# Patient Record
Sex: Female | Born: 1980 | Race: White | Hispanic: No | Marital: Single
Health system: Midwestern US, Academic
[De-identification: ages and names within clinical notes are randomized; demographics above are authoritative.]

---

## 2016-08-21 ENCOUNTER — Encounter
Admit: 2016-08-21 | Discharge: 2016-08-21 | Payer: MEDICAID | Primary: Student in an Organized Health Care Education/Training Program

## 2016-08-21 MED ORDER — PENTOSAN POLYSULFATE SODIUM 100 MG PO CAP
100 mg | ORAL_CAPSULE | Freq: Three times a day (TID) | ORAL | 11 refills | Status: AC
Start: 2016-08-21 — End: 2017-07-03

## 2016-09-19 ENCOUNTER — Ambulatory Visit
Admit: 2016-09-19 | Discharge: 2016-09-19 | Payer: MEDICAID | Primary: Student in an Organized Health Care Education/Training Program

## 2016-09-19 ENCOUNTER — Encounter
Admit: 2016-09-19 | Discharge: 2016-09-19 | Payer: MEDICAID | Primary: Student in an Organized Health Care Education/Training Program

## 2016-09-19 DIAGNOSIS — R102 Pelvic and perineal pain: ICD-10-CM

## 2016-09-19 DIAGNOSIS — N3010 Interstitial cystitis (chronic) without hematuria: Principal | ICD-10-CM

## 2016-09-19 DIAGNOSIS — J449 Chronic obstructive pulmonary disease, unspecified: ICD-10-CM

## 2016-09-19 DIAGNOSIS — I219 Acute myocardial infarction, unspecified: ICD-10-CM

## 2016-09-19 DIAGNOSIS — F99 Mental disorder, not otherwise specified: ICD-10-CM

## 2016-09-19 DIAGNOSIS — C801 Malignant (primary) neoplasm, unspecified: ICD-10-CM

## 2016-09-19 DIAGNOSIS — K929 Disease of digestive system, unspecified: ICD-10-CM

## 2016-09-19 MED ORDER — OXYBUTYNIN CHLORIDE 15 MG PO TR24
15 mg | ORAL_TABLET | Freq: Every day | ORAL | 3 refills | 12.00000 days | Status: AC
Start: 2016-09-19 — End: 2016-11-01

## 2016-09-19 MED ORDER — PENTOSAN POLYSULFATE SODIUM 100 MG PO CAP
100 mg | ORAL_CAPSULE | Freq: Three times a day (TID) | ORAL | 11 refills | Status: AC
Start: 2016-09-19 — End: 2017-08-01

## 2016-09-19 NOTE — Progress Notes
Date of Service: 09/19/2016     Chief Complaint   Patient presents with   ??? Bladder Pain       Subjective:             Adriana Tran is a 36 y.o. female.    History of Present Illness    36 yo female with history of IC previously managed by Dr. Littie Deeds.  At first visit, she had discontinued her triple therapy.  We restarted therapy and continued mirabegron but not covered.  We also referred for pelvic floor PT.  UA neg.  PVR 8 cc.  Currently complaining of urge incontinence and wearing pads daily.  AUA ss 13, bother 5.     Review of Systems      Constitutional: Negative for fever, chills, fatigue and unexpected weight change.   HENT: Negative for hearing loss, sore throat and voice change.    Eyes: Negative for visual disturbance.   Respiratory: Negative for cough, shortness of breath and wheezing.    Cardiovascular: Negative for chest pain, palpitations and leg swelling.   Gastrointestinal: Negative for nausea, vomiting, abdominal pain, diarrhea and constipation.   Endocrine: Negative for polydipsia and polyuria.   Musculoskeletal: Negative for back pain, joint swelling and arthralgias.   Skin: Negative for rash.   Neurological: Negative for dizziness, seizures, weakness, light-headedness and headaches.   Hematological: Negative for adenopathy. Does not bruise/bleed easily.   Psychiatric/Behavioral: Negative for suicidal ideas and confusion.       Objective:         ??? CARIPRAZINE HCL (VRAYLAR PO) Take 3 mg by mouth daily.   ??? diazePAM (VALIUM) 5 mg tablet Take 5 mg by mouth twice daily.   ??? dicyclomine (BENTYL) 20 mg tablet Take 20 mg by mouth four times daily.   ??? emtricitabine/tenofovir (TRUVADA) 200/300 mg tablet Take 1 tablet by mouth daily. Indications: PREVENTION OF HIV INFECTION AFTER EXPOSURE   ??? gabapentin (NEURONTIN) 600 mg tablet Take 600 mg by mouth three times daily.   ??? hydrOXYzine (ATARAX) 25 mg tablet Take 25 mg by mouth twice daily. ??? lithium carbonate 300 mg tablet Take 300 mg by mouth three times daily. Take with food.   ??? loratadine (CLARITIN) 10 mg tablet Take 10 mg by mouth daily.   ??? MULTIVITAMINS WITH FLUORIDE (MULTI-VITAMIN PO) Take  by mouth.   ??? MYRBETRIQ 25 mg Tb24 Take 1 Tab by mouth daily.   ??? OLANZapine (ZYPREXA) 20 mg tablet Take 20 mg by mouth twice daily.   ??? OXcarbazepine (TRILEPTAL) 600 mg tablet Take 600 mg by mouth twice daily.   ??? pentosan polysulfate sodium (ELMIRON) 100 mg capsule Take 1 capsule by mouth three times daily. Take w/ water 1 hr before or 2 hrs after meals   ??? prazosin (MINIPRESS) 1 mg capsule Take 1 mg by mouth at bedtime daily.   ??? raltegravir (ISENTRESS) 400 mg tablet Take 1 tablet by mouth twice daily.   ??? rOPINIRole (REQUIP) 1 mg tablet Take 1 mg by mouth three times daily.   ??? traZODone (DESYREL) 100 mg tablet Take 100 mg by mouth at bedtime daily.   ??? triamcinolone acetonide (KENALOG) 0.1 % topical ointment to raised red skin only twice daily as needed, avoid face/grion/armpits     Vitals:    09/19/16 1335   BP: 104/64   Pulse: 72   Weight: 94.2 kg (207 lb 9.6 oz)   Height: 174 cm (68.5)     Body mass index  is 31.11 kg/m???.     Physical Exam    Gen - NAD  HENT - normocephalic  Neck - normal range of motion  Heart - normal rate  Lungs - normal effort  Ext - no edema, erythema or tenderness  Neuro - alert and oriented x 3  Skin - warm and dry  Psych - normal behavior       Assessment and Plan:    Visit Dx:  1. Interstitial cystitis    2. Pelvic pain      Ditropan 15mg XL  Restart ditropan XL  F/U in 1 year with Danella Sensing                Orders Placed This Encounter   ??? POC URINE DIPSTICK MANUAL READ       Vonna Drafts, MD, MPH

## 2016-09-19 NOTE — Progress Notes
PVR 8 ml

## 2016-09-26 ENCOUNTER — Encounter
Admit: 2016-09-26 | Discharge: 2016-09-26 | Payer: MEDICAID | Primary: Student in an Organized Health Care Education/Training Program

## 2016-09-26 NOTE — Telephone Encounter
Patient called in with a question regarding Myrbetriq script.     Called and left message for patient to return call to clinic.

## 2016-09-29 ENCOUNTER — Encounter
Admit: 2016-09-29 | Discharge: 2016-09-29 | Payer: MEDICAID | Primary: Student in an Organized Health Care Education/Training Program

## 2016-09-29 ENCOUNTER — Ambulatory Visit
Admit: 2016-09-29 | Discharge: 2016-09-29 | Payer: MEDICAID | Primary: Student in an Organized Health Care Education/Training Program

## 2016-09-29 DIAGNOSIS — J449 Chronic obstructive pulmonary disease, unspecified: ICD-10-CM

## 2016-09-29 DIAGNOSIS — C801 Malignant (primary) neoplasm, unspecified: ICD-10-CM

## 2016-09-29 DIAGNOSIS — F99 Mental disorder, not otherwise specified: ICD-10-CM

## 2016-09-29 DIAGNOSIS — I219 Acute myocardial infarction, unspecified: ICD-10-CM

## 2016-09-29 DIAGNOSIS — N3010 Interstitial cystitis (chronic) without hematuria: Principal | ICD-10-CM

## 2016-09-29 DIAGNOSIS — R52 Pain, unspecified: ICD-10-CM

## 2016-09-29 DIAGNOSIS — K929 Disease of digestive system, unspecified: ICD-10-CM

## 2016-09-29 NOTE — Progress Notes
Date of Service: 09/29/2016    Subjective:             Adriana Tran is a 36 y.o. female.    History of Present Illness  Adriana Tran is a 36 years old pleasant Caucasian female here today to establish care at GI clinic, previous patient of Adriana Tran for history of gas/bloating and abdominal pain.  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  EGD and colonoscopy was nonconclusive to find any diagnostic abnormality.  Currently she is complaining of generalized body pain, she does not point any area in her abdomen as hurts.  Pain is vague, nonradiating, occasionally accompanied with gas and occasional nausea.  Her bowel movement varies from loose to formed bowel movement.  She denies weight loss, dysphagia/odynophagia, blood in stool, recent change in bowel movement.    Of note she has significant history of anxiety/depression, and is on polypharmacy.  The patient she lost follow-up with her psychiatrist in outside hospital, and she is looking find a new psychiatrist.       Review of Systems   Constitutional: Positive for fatigue.   HENT: Negative.    Eyes: Negative.    Respiratory: Negative.    Cardiovascular: Negative.    Gastrointestinal: Positive for abdominal distention and nausea.   Endocrine: Negative.    Neurological: Negative.    Psychiatric/Behavioral: Positive for dysphoric mood and sleep disturbance. The patient is nervous/anxious.      Past Medical History:   Diagnosis Date   ??? Cancer (HCC)     cervical ca   ??? COPD (chronic obstructive pulmonary disease) (HCC)    ??? Gastrointestinal disorder     gastroparesis   ??? IC (interstitial cystitis)    ??? Myocardial infarction (HCC)     2014   ??? Psychiatric illness     Bipolar, schizophrenia, panic attacks     Past Surgical History:   Procedure Laterality Date ??? HX CHOLECYSTECTOMY  2005   ??? PR COLONOSCOPY FLX DX W/COLLJ SPEC WHEN PFRMD N/A 07/29/2014    COLONOSCOPY performed by Adriana Chary, MD at [facility]   ??? PR BREATH HYDROGEN/METHANE TEST N/A 08/31/2015    HYDROGEN BREATH TEST performed by Adriana Tran, MBBS at [facility]     Family History   Problem Relation Age of Onset   ??? Other Maternal Grandfather         kidney failure     Social History     Social History   ??? Marital status: Single     Spouse name: N/A   ??? Number of children: N/A   ??? Years of education: N/A     Social History Main Topics   ??? Smoking status: Current Every Day Smoker     Packs/day: 2.00     Types: Cigarettes   ??? Smokeless tobacco: Never Used   ??? Alcohol use 0.0 oz/week      Comment: occ   ??? Drug use: No   ??? Sexual activity: Not on file     Other Topics Concern   ??? Not on file     Social History Narrative   ??? No narrative on file       Objective:         ??? CARIPRAZINE HCL (VRAYLAR PO) Take 3 mg by mouth daily.   ??? diazePAM (VALIUM) 5 mg tablet Take 5  mg by mouth twice daily.   ??? dicyclomine (BENTYL) 20 mg tablet Take 20 mg by mouth four times daily.   ??? emtricitabine/tenofovir (TRUVADA) 200/300 mg tablet Take 1 tablet by mouth daily. Indications: PREVENTION OF HIV INFECTION AFTER EXPOSURE   ??? gabapentin (NEURONTIN) 600 mg tablet Take 600 mg by mouth three times daily.   ??? hydrOXYzine (ATARAX) 25 mg tablet Take 25 mg by mouth twice daily.   ??? lithium carbonate 300 mg tablet Take 300 mg by mouth three times daily. Take with food.   ??? loratadine (CLARITIN) 10 mg tablet Take 10 mg by mouth daily.   ??? MULTIVITAMINS WITH FLUORIDE (MULTI-VITAMIN PO) Take  by mouth.   ??? MYRBETRIQ 25 mg Tb24 Take 1 Tab by mouth daily.   ??? OLANZapine (ZYPREXA) 20 mg tablet Take 20 mg by mouth twice daily.   ??? OXcarbazepine (TRILEPTAL) 600 mg tablet Take 600 mg by mouth twice daily.   ??? oxybutynin XL (DITROPAN XL) 15 mg tablet Take 1 tablet by mouth daily. Do not cut/ crush/ chew ??? pentosan polysulfate sodium (ELMIRON) 100 mg capsule Take 1 capsule by mouth three times daily. Take w/ water 1 hr before or 2 hrs after meals   ??? pentosan polysulfate sodium (ELMIRON) 100 mg capsule Take 1 capsule by mouth three times daily. Take w/ water 1 hr before or 2 hrs after meals   ??? prazosin (MINIPRESS) 1 mg capsule Take 1 mg by mouth at bedtime daily.   ??? raltegravir (ISENTRESS) 400 mg tablet Take 1 tablet by mouth twice daily.   ??? rOPINIRole (REQUIP) 1 mg tablet Take 1 mg by mouth three times daily.   ??? terbinafine (LAMISIL) 250 mg tablet Take 250 mg by mouth daily.   ??? traZODone (DESYREL) 100 mg tablet Take 100 mg by mouth at bedtime daily.   ??? triamcinolone acetonide (KENALOG) 0.1 % topical ointment to raised red skin only twice daily as needed, avoid face/grion/armpits     Vitals:    09/29/16 1523   BP: 97/70   Pulse: 69   Resp: 16   Temp: 36.7 ???C (98 ???F)   TempSrc: Oral   Weight: 93.6 kg (206 lb 6.4 oz)   Height: 174 cm (68.5)     Body mass index is 30.93 kg/m???.     Physical Exam   Constitutional: She appears well-developed.   HENT:   Head: Normocephalic.   Mouth/Throat: Oropharynx is clear and moist.   Eyes: Conjunctivae are normal. Pupils are equal, round, and reactive to light.   Neck: Normal range of motion. Neck supple.   Cardiovascular: Normal rate and regular rhythm.    Pulmonary/Chest: Effort normal and breath sounds normal.   Abdominal: Soft. Bowel sounds are normal. She exhibits no distension. There is no tenderness.   Musculoskeletal: Normal range of motion.   Neurological: She is alert.   Skin: Skin is warm.   Psychiatric:   Flat mood/affect, monotone speech, not very much cooperative            Assessment and Plan:  36 years old Caucasian female with history of functional abdominal pain syndrome, history of a small intestinal bacterial overgrowth, status post treatment with antibiotic, return visit to establish care with me, previous patient of Adriana Tran. It seems there is a big component of psychiatric, functional abdominal pain, and generalized body aches syndrome.    I have seen the patient in presence of Mallary Belarus, Charity fundraiser, we discussed in detail about the management.  Patient will be referred to psychiatry, for further evaluation, treatment plan.  Refer to pain clinic for nonpharmacological treatment of functional abdominal pain syndrome, generalized body ache.  This is crucial for patient to understand the polypharmacy, side effect, interaction of medication which she is currently taking.  It is very important in her care to be on least amount of medication, no narcotic and  Address current psychiatric issue.  Regarding gastroparesis, I would recommend her to be on gastroparesis diet.  Although gastric emptying is very mildly delayed at 4 hour while she was a polypharmacy, and most of those medication could potentially delay gastric emptying.  This is not very much fit with diagnosis of gastroparesis.  Patient was instructed to continue healthy diet, exercise, well hydration.  Cautious use of stool softener for having regular bowel movement.  If symptoms recur or having more gas/bloating we may consider a course of Xifaxan for possible recurrence of a small intestinal bacterial overgrowth.    Patient will be seen in GI clinic as needed by Darrol Angel, ARNP.    Plan of care were discussed in detail with patient, she verbalized understanding and agreed.    Total face to face time spent with patient: 30 minutes with >50% of that time spent counseling the patient on medications, prior study results related to symptoms, differential diagnosis, and options regarding the plan of care.     Thank you very much for the consult, and for allowing me to participate in this patient's care.   This note was in part completed with Dragon, a Chemical engineer.  Some grammatical errors may have occurred.  If you have concerns,please contact my office for clarification.

## 2016-10-05 ENCOUNTER — Encounter
Admit: 2016-10-05 | Discharge: 2016-10-05 | Payer: MEDICAID | Primary: Student in an Organized Health Care Education/Training Program

## 2016-10-20 ENCOUNTER — Encounter
Admit: 2016-10-20 | Discharge: 2016-10-21 | Payer: MEDICAID | Primary: Student in an Organized Health Care Education/Training Program

## 2016-10-20 ENCOUNTER — Ambulatory Visit
Admit: 2016-10-20 | Discharge: 2016-10-20 | Payer: MEDICAID | Primary: Student in an Organized Health Care Education/Training Program

## 2016-10-20 ENCOUNTER — Encounter
Admit: 2016-10-20 | Discharge: 2016-10-20 | Payer: MEDICAID | Primary: Student in an Organized Health Care Education/Training Program

## 2016-10-20 DIAGNOSIS — I219 Acute myocardial infarction, unspecified: ICD-10-CM

## 2016-10-20 DIAGNOSIS — Z113 Encounter for screening for infections with a predominantly sexual mode of transmission: Principal | ICD-10-CM

## 2016-10-20 DIAGNOSIS — N3010 Interstitial cystitis (chronic) without hematuria: Principal | ICD-10-CM

## 2016-10-20 DIAGNOSIS — Z124 Encounter for screening for malignant neoplasm of cervix: ICD-10-CM

## 2016-10-20 DIAGNOSIS — Z0000 Encounter for general adult medical examination without abnormal findings: ICD-10-CM

## 2016-10-20 DIAGNOSIS — F99 Mental disorder, not otherwise specified: ICD-10-CM

## 2016-10-20 DIAGNOSIS — C801 Malignant (primary) neoplasm, unspecified: ICD-10-CM

## 2016-10-20 DIAGNOSIS — J449 Chronic obstructive pulmonary disease, unspecified: ICD-10-CM

## 2016-10-20 DIAGNOSIS — IMO0001 Medical history reviewed with no changes: ICD-10-CM

## 2016-10-20 DIAGNOSIS — K929 Disease of digestive system, unspecified: ICD-10-CM

## 2016-10-20 DIAGNOSIS — Z01419 Encounter for gynecological examination (general) (routine) without abnormal findings: Principal | ICD-10-CM

## 2016-10-20 LAB — HIV 1& 2 AG-AB SCRN W REFLEX HIV 1 PCR QUANT: Lab: NEGATIVE

## 2016-10-20 LAB — SYPHILIS AB SCREEN: Lab: NEGATIVE

## 2016-10-20 NOTE — Progress Notes
Date of Service: 10/20/2016    Subjective:             Adriana Tran is a 36 y.o. female.    History of Present Illness    Pt. presents for WWE & Pap.  She was not accompanied by anyone today.    Adriana Tran has a history of bipolar disorder, schizophrenia, & panic attacks.  Under care of a psychiatrist in [place], [place].  Pt. has an appointment to establish care here in Psychiatry in October, 2018.    Pt. states she had a brown recluse spider bite on her abdomen three months ago that has burrowed a channel into her abdomen.   States no one believes her.   Makes her very anxious.  She desires to change PCP's & when offered, states she would like to   establish here in IM Clinic.  Also adds that she has fibromyalgia, causing daily leg, shoulder, back pain.  Regular menses every month.  Adriana Tran of last pap.  > 3-4 yrs ago per pt.  When questioned regarding her periods, pt.only knows  they are monthly.  Not sexually active x six months per pt..  Does not use contraception.    Requests STD screening today.  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  Takes Truvada daily.      Unable to complete accurate medical history & ROS intake due to pt. mental illness.      Obstetric History     No data available     Pt. reports 15 pregnancies.  No children.      Patient's last menstrual period was 09/29/2016 (exact date).  Menstrual History   ??? Age at menarche 44    ??? Duration of menses monthly    ??? Length of Cycle unsure    ??? Menopause?     ??? Age at menopause        STD history- uncertain/none reported      (Unable verify the reliability of the ROS due to pt.'s mental illness.)     Review of Systems   Constitutional: Negative for fatigue, fever and unexpected weight change.   HENT: Negative for voice change.    Respiratory: Negative for cough and shortness of breath.    Cardiovascular: Negative for chest pain and leg swelling.   Gastrointestinal: Negative for abdominal pain, blood in stool, constipation, diarrhea, nausea and vomiting.   Genitourinary: Negative for difficulty urinating, dyspareunia, dysuria, enuresis, frequency, genital sores, hematuria, menstrual problem, pelvic pain, urgency, vaginal bleeding, vaginal discharge and vaginal pain.   Musculoskeletal: Negative for arthralgias and back pain.   Skin: Negative for rash.   Neurological: Negative for light-headedness and headaches.   Hematological: Negative for adenopathy. Does not bruise/bleed easily.   Psychiatric/Behavioral: Negative for confusion. The patient is not nervous/anxious.      Past Medical History:   Diagnosis Date   ??? Cancer (HCC)     cervical ca   ??? COPD (chronic obstructive pulmonary disease) (HCC)    ??? Gastrointestinal disorder     gastroparesis   ??? IC (interstitial cystitis)    ??? Medical history reviewed with no changes    ??? Myocardial infarction (HCC)     2014   ??? Psychiatric illness     Bipolar, schizophrenia, panic attacks     Past Surgical History:   Procedure Laterality Date   ??? HX CHOLECYSTECTOMY  2005   ??? PR COLONOSCOPY FLX DX W/COLLJ SPEC WHEN PFRMD N/A 07/29/2014    COLONOSCOPY  performed by Raynelle Chary, MD at [facility]   ??? PR BREATH HYDROGEN/METHANE TEST N/A 08/31/2015    HYDROGEN BREATH TEST performed by Michelene Heady, MBBS at [facility]     Family History   Problem Relation Age of Onset   ??? Other Maternal Grandfather         kidney failure     Social History     Social History   ??? Marital status: Single     Spouse name: N/A   ??? Number of children: N/A   ??? Years of education: N/A     Occupational History   ??? Not on file.     Social History Main Topics   ??? Smoking status: Current Every Day Smoker     Packs/day: 2.00     Types: Cigarettes   ??? Smokeless tobacco: Never Used   ??? Alcohol use 0.0 oz/week      Comment: occ   ??? Drug use: No   ??? Sexual activity: No     Other Topics Concern   ??? Not on file     Social History Narrative   ??? No narrative on file       Objective: ??? CARIPRAZINE HCL (VRAYLAR PO) Take 3 mg by mouth daily.   ??? diazePAM (VALIUM) 5 mg tablet Take 5 mg by mouth twice daily.   ??? dicyclomine (BENTYL) 20 mg tablet Take 20 mg by mouth four times daily.   ??? emtricitabine/tenofovir (TRUVADA) 200/300 mg tablet Take 1 tablet by mouth daily. Indications: PREVENTION OF HIV INFECTION AFTER EXPOSURE   ??? gabapentin (NEURONTIN) 600 mg tablet Take 600 mg by mouth three times daily.   ??? hydrOXYzine (ATARAX) 25 mg tablet Take 25 mg by mouth twice daily.   ??? lithium carbonate 300 mg tablet Take 300 mg by mouth three times daily. Take with food.   ??? loratadine (CLARITIN) 10 mg tablet Take 10 mg by mouth daily.   ??? MULTIVITAMINS WITH FLUORIDE (MULTI-VITAMIN PO) Take  by mouth.   ??? MYRBETRIQ 25 mg Tb24 Take 1 Tab by mouth daily.   ??? OLANZapine (ZYPREXA) 20 mg tablet Take 20 mg by mouth twice daily.   ??? OXcarbazepine (TRILEPTAL) 600 mg tablet Take 600 mg by mouth twice daily.   ??? oxybutynin XL (DITROPAN XL) 15 mg tablet Take 1 tablet by mouth daily. Do not cut/ crush/ chew   ??? pentosan polysulfate sodium (ELMIRON) 100 mg capsule Take 1 capsule by mouth three times daily. Take w/ water 1 hr before or 2 hrs after meals   ??? pentosan polysulfate sodium (ELMIRON) 100 mg capsule Take 1 capsule by mouth three times daily. Take w/ water 1 hr before or 2 hrs after meals   ??? prazosin (MINIPRESS) 1 mg capsule Take 1 mg by mouth at bedtime daily.   ??? raltegravir (ISENTRESS) 400 mg tablet Take 1 tablet by mouth twice daily.   ??? rOPINIRole (REQUIP) 1 mg tablet Take 1 mg by mouth three times daily.   ??? terbinafine (LAMISIL) 250 mg tablet Take 250 mg by mouth daily.   ??? traZODone (DESYREL) 100 mg tablet Take 100 mg by mouth at bedtime daily.   ??? triamcinolone acetonide (KENALOG) 0.1 % topical ointment to raised red skin only twice daily as needed, avoid face/grion/armpits     Vitals:    10/20/16 1141   BP: 106/72   Pulse: 76   Weight: 94.8 kg (209 lb)   Height: 174 cm (68.5) Body mass index is  31.32 kg/m???.     Physical Exam   Constitutional: She is oriented to person, place, and time. She appears well-developed and well-nourished. Distressed: visibly anxious.   HENT:   Head: Normocephalic and atraumatic.   Cardiovascular: Normal rate.    Pulmonary/Chest: Effort normal.   Abdominal: Soft. She exhibits no distension. There is no tenderness.   Genitourinary: Uterus normal. There is no rash, tenderness or lesion on the right labia. There is no rash, tenderness or lesion on the left labia. Uterus is not tender. Cervix exhibits no motion tenderness and no discharge. Right adnexum displays no mass and no tenderness. Left adnexum displays no mass and no tenderness. No erythema or tenderness in the vagina. No foreign body in the vagina. No vaginal discharge found.   Musculoskeletal: Normal range of motion.   Neurological: She is alert and oriented to person, place, and time.   Skin: Skin is warm and dry. She is not diaphoretic.   Mood-anxious  Behavior- pt. had several verbal outbursts, shifted in & out of anger when she discussed prior providers who did not listen to her symptoms.  Thought content- seems tangential at times.          Assessment and Plan:    1. Well woman exam with routine gynecological exam     2. Cervical cancer screening  CYTOLOGY PAP SMEAR SCREENING   3. Screening for STD (sexually transmitted disease)  HIV-1/2 ANTIGEN/ANTIBODY SCREEN    SYPHILIS AB SCREEN    CHLAM/NG PCR THIN PREP   4. Chronic mental illness  AMB REFERRAL TO INTERNAL MEDICINE   5. Health care maintenance  AMB REFERRAL TO INTERNAL MEDICINE     --Pap cotesting.  --STD screening.  --Pt. states she prefers to change PCP's.  Referral for IM to establish for management of her care..  --Pt. educated re:  Field seismologist, Breast self awareness, STD protection/use of condoms routinely, good nutrition/exercise, Vit D/Ca intake daily, & routine health care maintenance.  --RTC annually for WWE & prn. Queen Blossom, PA-C

## 2016-10-22 ENCOUNTER — Encounter
Admit: 2016-10-22 | Discharge: 2016-10-22 | Payer: MEDICAID | Primary: Student in an Organized Health Care Education/Training Program

## 2016-10-22 DIAGNOSIS — IMO0001 Medical history reviewed with no changes: ICD-10-CM

## 2016-10-22 DIAGNOSIS — K929 Disease of digestive system, unspecified: ICD-10-CM

## 2016-10-22 DIAGNOSIS — N3010 Interstitial cystitis (chronic) without hematuria: Principal | ICD-10-CM

## 2016-10-22 DIAGNOSIS — F99 Mental disorder, not otherwise specified: ICD-10-CM

## 2016-10-22 DIAGNOSIS — J449 Chronic obstructive pulmonary disease, unspecified: ICD-10-CM

## 2016-10-22 DIAGNOSIS — I219 Acute myocardial infarction, unspecified: ICD-10-CM

## 2016-10-22 DIAGNOSIS — C801 Malignant (primary) neoplasm, unspecified: ICD-10-CM

## 2016-10-23 DIAGNOSIS — Z113 Encounter for screening for infections with a predominantly sexual mode of transmission: Principal | ICD-10-CM

## 2016-10-23 LAB — CHLAM/NG PCR THIN PREP
Lab: NEGATIVE
Lab: NEGATIVE

## 2016-10-25 ENCOUNTER — Encounter
Admit: 2016-10-25 | Discharge: 2016-10-25 | Payer: MEDICAID | Primary: Student in an Organized Health Care Education/Training Program

## 2016-11-01 ENCOUNTER — Encounter
Admit: 2016-11-01 | Discharge: 2016-11-01 | Payer: MEDICAID | Primary: Student in an Organized Health Care Education/Training Program

## 2016-11-01 MED ORDER — MYRBETRIQ 25 MG PO TB24
25 mg | ORAL_TABLET | Freq: Every day | ORAL | 1 refills | Status: AC
Start: 2016-11-01 — End: 2017-02-07

## 2016-11-01 NOTE — Telephone Encounter
Spoke with patient and she does not like the Oxybutynin that she is currently on.  She would like to go back onto the Myrbetriq. Rx called into the pharm and no further questions at this time.

## 2016-11-08 ENCOUNTER — Encounter
Admit: 2016-11-08 | Discharge: 2016-11-08 | Payer: MEDICAID | Primary: Student in an Organized Health Care Education/Training Program

## 2016-11-08 ENCOUNTER — Ambulatory Visit
Admit: 2016-11-08 | Discharge: 2016-11-09 | Payer: MEDICAID | Primary: Student in an Organized Health Care Education/Training Program

## 2016-11-08 DIAGNOSIS — C801 Malignant (primary) neoplasm, unspecified: ICD-10-CM

## 2016-11-08 DIAGNOSIS — J449 Chronic obstructive pulmonary disease, unspecified: ICD-10-CM

## 2016-11-08 DIAGNOSIS — K929 Disease of digestive system, unspecified: ICD-10-CM

## 2016-11-08 DIAGNOSIS — I219 Acute myocardial infarction, unspecified: ICD-10-CM

## 2016-11-08 DIAGNOSIS — R1013 Epigastric pain: Secondary | ICD-10-CM

## 2016-11-08 DIAGNOSIS — F99 Mental disorder, not otherwise specified: ICD-10-CM

## 2016-11-08 DIAGNOSIS — IMO0001 Medical history reviewed with no changes: ICD-10-CM

## 2016-11-08 DIAGNOSIS — N3010 Interstitial cystitis (chronic) without hematuria: Principal | ICD-10-CM

## 2016-11-08 NOTE — Progress Notes
[facility] HISTORY AND PHYSICAL    Chief Complaint   Patient presents with   ??? Pelvis - Abdominal pain   ??? Right Foot - Pain   ??? Left Foot - Pain   ??? Head - Migraine   ??? Other     NP stomach pain       Subjective     HISTORY OF PRESENT ILLNESS:  Adriana Tran presents to the [facility] for evaluation and treatment recommendations regarding abdominal pain and headache pain.  The abdominal pain is epigastric and non-radiating.  It has been present for several years.  The headache pain is bi-frontal and occurs twice per week.  She denies bowel or bladder incontinence.  She denies diarrhea or constipation.  She has not experienced nausea or emesis. Headache pain is controlled with naproxen.         Past Medical History:   Diagnosis Date   ??? Cancer (HCC)     cervical ca   ??? COPD (chronic obstructive pulmonary disease) (HCC)    ??? Gastrointestinal disorder     gastroparesis   ??? IC (interstitial cystitis)    ??? Medical history reviewed with no changes    ??? Myocardial infarction (HCC)     2014   ??? Psychiatric illness     Bipolar, schizophrenia, panic attacks       Past Surgical History:   Procedure Laterality Date   ??? HX CHOLECYSTECTOMY  2005   ??? PR COLONOSCOPY FLX DX W/COLLJ SPEC WHEN PFRMD N/A 07/29/2014    COLONOSCOPY performed by Raynelle Chary, MD at [facility]   ??? PR BREATH HYDROGEN/METHANE TEST N/A 08/31/2015    HYDROGEN BREATH TEST performed by Michelene Heady, MBBS at [facility]       family history includes Other in her maternal grandfather.    Social History     Social History   ??? Marital status: Single     Spouse name: N/A   ??? Number of children: N/A   ??? Years of education: N/A     Occupational History   ??? Not on file.     Social History Main Topics   ??? Smoking status: Current Every Day Smoker     Packs/day: 2.00     Types: Cigarettes   ??? Smokeless tobacco: Never Used   ??? Alcohol use 0.0 oz/week      Comment: occ   ??? Drug use: No   ??? Sexual activity: No     Other Topics Concern   ??? Not on file Social History Narrative   ??? No narrative on file       Allergies   Allergen Reactions   ??? Cephalosporins      Allergy recorded in SMS: Pcn/Cephalospor~Reactions: UNKNOWN   ??? Penicillins      Allergy recorded in SMS: Pcn/Cephalospor~Reactions: UNKNOWN   ??? Amoxicillin UNKNOWN   ??? Bactrim [Sulfamethoxazole-Trimethoprim] UNKNOWN   ??? Cephalexin UNKNOWN   ??? Clindamycin SEE COMMENTS     Took for dental work and made gums swell   ??? Haldol [Haloperidol Lactate] SEE COMMENTS     increased psychotic activity   ??? Lamotrigine UNKNOWN   ??? Mirtazapine SEE COMMENTS     Insomnia     ??? Paliperidone UNKNOWN   ??? Phenergan [Promethazine] UNKNOWN   ??? Risperidone UNKNOWN   ??? Seroquel [Quetiapine] UNKNOWN       Current Outpatient Prescriptions on File Prior to Visit   Medication Sig Dispense Refill   ??? CARIPRAZINE  HCL (VRAYLAR PO) Take 3 mg by mouth daily.     ??? diazePAM (VALIUM) 5 mg tablet Take 5 mg by mouth twice daily.     ??? dicyclomine (BENTYL) 20 mg tablet Take 20 mg by mouth four times daily.     ??? emtricitabine/tenofovir (TRUVADA) 200/300 mg tablet Take 1 tablet by mouth daily. Indications: PREVENTION OF HIV INFECTION AFTER EXPOSURE 25 tablet 0   ??? gabapentin (NEURONTIN) 600 mg tablet Take 600 mg by mouth three times daily.     ??? hydrOXYzine (ATARAX) 25 mg tablet Take 25 mg by mouth twice daily.     ??? lithium carbonate 300 mg tablet Take 300 mg by mouth three times daily. Take with food.     ??? loratadine (CLARITIN) 10 mg tablet Take 10 mg by mouth daily.     ??? MULTIVITAMINS WITH FLUORIDE (MULTI-VITAMIN PO) Take  by mouth.     ??? MYRBETRIQ 25 mg tablet Take one tablet by mouth daily. 90 tablet 1   ??? OLANZapine (ZYPREXA) 20 mg tablet Take 20 mg by mouth twice daily.     ??? OXcarbazepine (TRILEPTAL) 600 mg tablet Take 600 mg by mouth twice daily.     ??? pentosan polysulfate sodium (ELMIRON) 100 mg capsule Take 1 capsule by mouth three times daily. Take w/ water 1 hr before or 2 hrs after meals 90 capsule 11 ??? pentosan polysulfate sodium (ELMIRON) 100 mg capsule Take 1 capsule by mouth three times daily. Take w/ water 1 hr before or 2 hrs after meals 90 capsule 11   ??? prazosin (MINIPRESS) 1 mg capsule Take 1 mg by mouth at bedtime daily.     ??? raltegravir (ISENTRESS) 400 mg tablet Take 1 tablet by mouth twice daily. 50 tablet 0   ??? rOPINIRole (REQUIP) 1 mg tablet Take 1 mg by mouth three times daily.     ??? terbinafine (LAMISIL) 250 mg tablet Take 250 mg by mouth daily.  0   ??? traZODone (DESYREL) 100 mg tablet Take 100 mg by mouth at bedtime daily.     ??? triamcinolone acetonide (KENALOG) 0.1 % topical ointment to raised red skin only twice daily as needed, avoid face/grion/armpits 80 g 1     No current facility-administered medications on file prior to visit.        Vitals:    11/08/16 0858   BP: 104/71   Pulse: 86   Weight: 94.3 kg (208 lb)   Height: 172.7 cm (68)            Female Opioid Risk Score: 3 (11/08/2016  9:00 AM)  Opioid Risk Category: Low Risk (11/08/2016  9:00 AM)  Is a controlled substance agreement on file?No    Pain Score: Four    Body mass index is 31.63 kg/m???.    Review of Systems   Constitutional: Negative.    HENT: Negative.    Eyes: Negative.    Respiratory: Negative.    Cardiovascular: Negative.    Gastrointestinal: Negative.    Endocrine: Negative.    Genitourinary: Negative.    Musculoskeletal: Negative.    Skin: Negative.    Allergic/Immunologic: Negative.    Neurological: Negative.    Hematological: Negative.    Psychiatric/Behavioral: Negative.    All other systems reviewed and are negative.           PHYSICAL EXAM:    General: Alert and oriented, very pleasant female.   HEENT showed extraocular muscles were intact and no other abnormalities.  Unlabored breathing.  Regular rate and rhythm on CV exam.   5/5 strength in bilateral upper and lower extremities.    Sensation is intact to light touch and equal in the upper and lower extremities.  Epigastric tenderness with trigger point palpable RADIOGRAPHIC EVALUATION:    CT ABD/PELV W CONTRAST Jul 15, 2013  IMPRESSION:   1. BILATERAL OVARIAN CYSTS WITH MILD PELVIC FREE FLUID. FINDINGS MAY BE   PHYSIOLOGIC, SECONDARY TO CYSTIC RUPTURE, AND/OR PID.     2. NO RADIOGRAPHIC EVIDENCE OF SMALL BOWEL OBSTRUCTION OR INFLAMMATORY   ABDOMINOPELVIC MASS.     IMPRESSION:    1. Myalgia    2. Epigastric pain    3. Spondylosis of lumbosacral region without myelopathy or radiculopathy    4. Migraine without aura and without status migrainosus, not intractable          PLAN: Will schedule an abdominal trigger point injection in the hope of reducing abdominal pain and continue with naproxen prn for headaches

## 2016-11-08 NOTE — Telephone Encounter
Left a message for patient that she would have to pay cash for her myrbetriq or she can stay on the oxybutynin.

## 2016-11-08 NOTE — Telephone Encounter
Pt was switched from oxybutynin to [employer] cover it. Her preferred is the oxybutynin. Ok to switch back?

## 2016-11-09 DIAGNOSIS — R52 Pain, unspecified: ICD-10-CM

## 2016-11-09 DIAGNOSIS — M47817 Spondylosis without myelopathy or radiculopathy, lumbosacral region: ICD-10-CM

## 2016-11-09 DIAGNOSIS — G43009 Migraine without aura, not intractable, without status migrainosus: ICD-10-CM

## 2016-11-09 DIAGNOSIS — M791 Myalgia: Principal | ICD-10-CM

## 2016-11-29 ENCOUNTER — Encounter
Admit: 2016-11-29 | Discharge: 2016-11-29 | Payer: MEDICAID | Primary: Student in an Organized Health Care Education/Training Program

## 2016-12-04 ENCOUNTER — Encounter
Admit: 2016-12-04 | Discharge: 2016-12-04 | Payer: MEDICAID | Primary: Student in an Organized Health Care Education/Training Program

## 2016-12-14 ENCOUNTER — Encounter
Admit: 2016-12-14 | Discharge: 2016-12-14 | Payer: MEDICAID | Primary: Student in an Organized Health Care Education/Training Program

## 2016-12-21 ENCOUNTER — Encounter
Admit: 2016-12-21 | Discharge: 2016-12-21 | Payer: MEDICAID | Primary: Student in an Organized Health Care Education/Training Program

## 2016-12-25 ENCOUNTER — Encounter
Admit: 2016-12-25 | Discharge: 2016-12-25 | Payer: MEDICAID | Primary: Student in an Organized Health Care Education/Training Program

## 2017-01-03 ENCOUNTER — Ambulatory Visit
Admit: 2017-01-03 | Discharge: 2017-01-04 | Payer: MEDICAID | Primary: Student in an Organized Health Care Education/Training Program

## 2017-01-03 ENCOUNTER — Encounter
Admit: 2017-01-03 | Discharge: 2017-01-03 | Payer: MEDICAID | Primary: Student in an Organized Health Care Education/Training Program

## 2017-01-03 DIAGNOSIS — K929 Disease of digestive system, unspecified: ICD-10-CM

## 2017-01-03 DIAGNOSIS — N3010 Interstitial cystitis (chronic) without hematuria: Principal | ICD-10-CM

## 2017-01-03 DIAGNOSIS — C801 Malignant (primary) neoplasm, unspecified: ICD-10-CM

## 2017-01-03 DIAGNOSIS — K9289 Other specified diseases of the digestive system: Principal | ICD-10-CM

## 2017-01-03 DIAGNOSIS — F99 Mental disorder, not otherwise specified: ICD-10-CM

## 2017-01-03 DIAGNOSIS — J449 Chronic obstructive pulmonary disease, unspecified: ICD-10-CM

## 2017-01-03 DIAGNOSIS — IMO0001 Medical history reviewed with no changes: ICD-10-CM

## 2017-01-03 DIAGNOSIS — I219 Acute myocardial infarction, unspecified: ICD-10-CM

## 2017-01-03 NOTE — Progress Notes
Date of Service: 01/03/2017    Subjective:             Adriana Tran is a 36 y.o. female.    History of Present Illness  Follow-up visit for history of gas/bloating.  History of very mild delayed gastric emptying (fourth hour emptying 11%), history of small bacterial overgrowth, status post treatment with antibiotic.  Patient has history of anxiety/depression, schizophrenia, she is on polypharmacy.  Last I would recommend that the patient to be a close follow-up with psychiatrist at [place], she did not follow did not see any psychiatrist here.  Complaint of all her symptom is related to polypharmacy and psychiatric disorder.  I visited the patient and discussed and went through all conversation when Myrlene Broker, RN was present in exam room.    Past Medical History:   Diagnosis Date   ??? Cancer (HCC)     cervical ca   ??? COPD (chronic obstructive pulmonary disease) (HCC)    ??? Gastrointestinal disorder     gastroparesis   ??? IC (interstitial cystitis)    ??? Medical history reviewed with no changes    ??? Myocardial infarction (HCC)     2014   ??? Psychiatric illness     Bipolar, schizophrenia, panic attacks     Past Surgical History:   Procedure Laterality Date   ??? HX CHOLECYSTECTOMY  2005   ??? PR COLONOSCOPY FLX DX W/COLLJ SPEC WHEN PFRMD N/A 07/29/2014    COLONOSCOPY performed by Raynelle Chary, MD at [facility]   ??? PR BREATH HYDROGEN/METHANE TEST N/A 08/31/2015    HYDROGEN BREATH TEST performed by Michelene Heady, MBBS at [facility]     Family History   Problem Relation Age of Onset   ??? Other Maternal Grandfather         kidney failure     Social History     Social History   ??? Marital status: Single     Spouse name: N/A   ??? Number of children: N/A   ??? Years of education: N/A     Social History Main Topics   ??? Smoking status: Current Every Day Smoker     Packs/day: 2.00     Types: Cigarettes   ??? Smokeless tobacco: Never Used   ??? Alcohol use No      Comment: occ   ??? Drug use: No   ??? Sexual activity: No     Other Topics Concern ??? Not on file     Social History Narrative   ??? No narrative on file                    Review of Systems   Constitutional: Negative.    HENT: Negative.    Eyes: Negative.    Respiratory: Negative.    Cardiovascular: Negative.    Genitourinary: Negative.    Musculoskeletal: Negative.    Neurological: Negative.    Hematological: Negative.    All other systems reviewed and are negative.        Objective:         ??? CARIPRAZINE HCL (VRAYLAR PO) Take 3 mg by mouth daily.   ??? diazePAM (VALIUM) 5 mg tablet Take 5 mg by mouth twice daily.   ??? dicyclomine (BENTYL) 20 mg tablet Take 20 mg by mouth four times daily.   ??? emtricitabine/tenofovir (TRUVADA) 200/300 mg tablet Take 1 tablet by mouth daily. Indications: PREVENTION OF HIV INFECTION AFTER EXPOSURE   ??? gabapentin (NEURONTIN) 600 mg  tablet Take 600 mg by mouth three times daily.   ??? hydrOXYzine (ATARAX) 25 mg tablet Take 25 mg by mouth three times daily.   ??? lithium carbonate 300 mg tablet Take 300 mg by mouth three times daily. Take with food.   ??? loratadine (CLARITIN) 10 mg tablet Take 10 mg by mouth daily.   ??? MULTIVITAMINS WITH FLUORIDE (MULTI-VITAMIN PO) Take  by mouth.   ??? MYRBETRIQ 25 mg tablet Take one tablet by mouth daily.   ??? OLANZapine (ZYPREXA) 20 mg tablet Take 20 mg by mouth twice daily.   ??? OLANZapine (ZYPREXA) 5 mg tablet Take 5 mg by mouth daily.   ??? OXcarbazepine (TRILEPTAL) 600 mg tablet Take 600 mg by mouth twice daily.   ??? oxybutynin XL (DITROPAN XL) 15 mg tablet Take 15 mg by mouth daily.   ??? pentosan polysulfate sodium (ELMIRON) 100 mg capsule Take 1 capsule by mouth three times daily. Take w/ water 1 hr before or 2 hrs after meals   ??? pentosan polysulfate sodium (ELMIRON) 100 mg capsule Take 1 capsule by mouth three times daily. Take w/ water 1 hr before or 2 hrs after meals   ??? prazosin (MINIPRESS) 1 mg capsule Take 1 mg by mouth at bedtime daily.   ??? raltegravir (ISENTRESS) 400 mg tablet Take 1 tablet by mouth twice daily. ??? rOPINIRole (REQUIP) 1 mg tablet Take 1 mg by mouth three times daily.   ??? terbinafine (LAMISIL) 250 mg tablet Take 250 mg by mouth daily.   ??? traZODone (DESYREL) 100 mg tablet Take 100 mg by mouth at bedtime daily.   ??? triamcinolone acetonide (KENALOG) 0.1 % topical ointment to raised red skin only twice daily as needed, avoid face/grion/armpits     Vitals:    01/03/17 0811   BP: 95/63   Pulse: 80   Resp: 20   Temp: 36.4 ???C (97.5 ???F)   TempSrc: Oral   Weight: 95.9 kg (211 lb 6.4 oz)   Height: 174 cm (68.5)     Body mass index is 31.68 kg/m???.     Physical Exam   Constitutional: She is oriented to person, place, and time. She appears well-developed and well-nourished.   Neurological: She is alert and oriented to person, place, and time.            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    As we discussed in the last visit the major components of all her symptoms is related to polypharmacy, underlying psychiatric issue.  I would definitely request one of our psychiatrist at [place] see the patient and followed by psychiatry.  The need for addressing polypharmacy again emphasized today, most of them could contribute with gas/bloating, constipation, and functional abdominal pain syndrome.  Will check for small intestinal bacterial overgrowth with breath test.  Patient was instructed to take a stool softener i.e. Dulcolax, Colace, as needed for having more soft regular bowel movement.  Patient will be seen in GI clinic in 3 months by Darrol Angel, ARNP.    Plan of care was discussed in detail with patient, she verbalized understanding and agreed.    Total face to face time spent with patient: 30 minutes with >50% of that time spent counseling the patient on medications, prior study results related to symptoms, differential diagnosis, and options regarding the plan of care.  Thank you very much for the consult, and for allowing me to participate in this patient's care.   This note was in part completed with Dragon, a Chemical engineer.  Some grammatical errors may have occurred.  If you have concerns,please contact my office for clarification.

## 2017-01-09 ENCOUNTER — Encounter
Admit: 2017-01-09 | Discharge: 2017-01-09 | Payer: MEDICAID | Primary: Student in an Organized Health Care Education/Training Program

## 2017-01-09 NOTE — Telephone Encounter
Pt left VMX states possibility she is pregnant. Had protected sex but the condom broke, "she thinks". 10/21 was the first time. She needs blood work, states it usually takes 6-8 weeks to show.    Rt call to pt who states "taking a UPT will not help. Pt states she has taken UPT before told she was not pregnant then would go two weeks later and was pregnant. Advised pt since she had sex on 10/21 it has not yet been six weeks and would be too early to determine if she has a viable pregnancy. Pt also reports having pelvic pain that she has had off and on for some time. Has had cysts on her ovaries for several years but previous providers"just stopped caring" saw Was Dr[facility] Wenda Low at [facility] and was told she could do nothing for her. Reports it is not every day but she has been having consistently. Advised pt we would need to schedule an appt with a different provider for her pelvic pain issues.would need to send a message to [facility] for recommendations and will call her tomorrow. Pt verbalized understanding, no further questions.

## 2017-01-10 NOTE — Telephone Encounter
Spoke with pt.  Appt given with Dr Kendrick Fries on 11/16 to address pelvic pain.  Pt asking why she can't have a blood pregnancy tests because she states urine doesn't work.  Advised I would send her request to [facility] and we would let her know.  LMP approx 12/27/16.

## 2017-01-11 NOTE — Telephone Encounter
Spoke with pt and advised.  Pt states she is not going to ER but will keep appt next week.  Pt undecided as to whether she will do pregnancy test or not.  Pt started menses today.

## 2017-01-22 ENCOUNTER — Encounter
Admit: 2017-01-22 | Discharge: 2017-01-22 | Payer: MEDICAID | Primary: Student in an Organized Health Care Education/Training Program

## 2017-01-22 NOTE — Telephone Encounter
Pt left V/M stating she missed her appointment 11.16.18, needing to reschedule.  Returned call to pt. advising pt she has a appointment 11.21.18.  Pt V/U.  Future Appointments  Date Time Provider [place]   01/24/2017 10:45 AM Diteresi, Apolonio Schneiders, MD [facility]   02/07/2017 2:30 PM Clovis Cao, MD [facility]   04/03/2017 4:00 PM Domenic Moras, APRN-NP [facility]   09/20/2017 11:15 AM West Carbo, MD [facility]

## 2017-02-06 ENCOUNTER — Encounter
Admit: 2017-02-06 | Discharge: 2017-02-06 | Payer: MEDICAID | Primary: Student in an Organized Health Care Education/Training Program

## 2017-02-06 ENCOUNTER — Ambulatory Visit
Admit: 2017-02-06 | Discharge: 2017-02-06 | Payer: MEDICAID | Primary: Student in an Organized Health Care Education/Training Program

## 2017-02-06 DIAGNOSIS — N3010 Interstitial cystitis (chronic) without hematuria: Principal | ICD-10-CM

## 2017-02-06 DIAGNOSIS — N949 Unspecified condition associated with female genital organs and menstrual cycle: ICD-10-CM

## 2017-02-06 DIAGNOSIS — J449 Chronic obstructive pulmonary disease, unspecified: ICD-10-CM

## 2017-02-06 DIAGNOSIS — R102 Pelvic and perineal pain: ICD-10-CM

## 2017-02-06 DIAGNOSIS — N926 Irregular menstruation, unspecified: ICD-10-CM

## 2017-02-06 DIAGNOSIS — I219 Acute myocardial infarction, unspecified: ICD-10-CM

## 2017-02-06 DIAGNOSIS — Z7251 High risk heterosexual behavior: ICD-10-CM

## 2017-02-06 DIAGNOSIS — K929 Disease of digestive system, unspecified: ICD-10-CM

## 2017-02-06 DIAGNOSIS — Z113 Encounter for screening for infections with a predominantly sexual mode of transmission: Principal | ICD-10-CM

## 2017-02-06 DIAGNOSIS — C801 Malignant (primary) neoplasm, unspecified: ICD-10-CM

## 2017-02-06 DIAGNOSIS — F99 Mental disorder, not otherwise specified: ICD-10-CM

## 2017-02-06 DIAGNOSIS — IMO0001 Medical history reviewed with no changes: ICD-10-CM

## 2017-02-06 LAB — DIRECT EXAM (WET PREP)

## 2017-02-06 LAB — BETA-HCG: Lab: <1 U/L (ref <5)

## 2017-02-06 MED ORDER — DOXYCYCLINE HYCLATE 100 MG PO TAB
100 mg | ORAL_TABLET | Freq: Two times a day (BID) | ORAL | 0 refills | 8.00000 days | Status: AC
Start: 2017-02-06 — End: ?

## 2017-02-06 NOTE — Progress Notes
Date of Service: 02/06/2017    Subjective:             Adriana Tran is a 36 y.o. female.    History of Present Illness    36 yo here for pelvic pain and STD check     Reports she has had on and off pain for 9 years. It is located in the midline, superficial and deep at times. The pain is sharp in nature and lasts seconds. Happened once this month and twice last month. In general nothing makes it worse. But she was on her cycle with last episode and it was worse. Reports history of ovarian cysts. Reports pain with intercourse at last encounter and vaginal burning. Sexually active, uses condoms for contraception. Had unprotected intercourse with boyfriend. Desires pregnancy test and STD screen.     LMP: 11/8 not a normal period   Regular periods, last <7 days  ASCUS, HPV neg in 2018    Past Medical History:   Diagnosis Date   ??? Cancer (HCC)     cervical ca   ??? COPD (chronic obstructive pulmonary disease) (HCC)    ??? Gastrointestinal disorder     gastroparesis   ??? IC (interstitial cystitis)    ??? Medical history reviewed with no changes    ??? Myocardial infarction (HCC)     2014   ??? Psychiatric illness     Bipolar, schizophrenia, panic attacks       Allergies   Allergen Reactions   ??? Cephalosporins      Allergy recorded in SMS: Pcn/Cephalospor~Reactions: UNKNOWN   ??? Penicillins      Allergy recorded in SMS: Pcn/Cephalospor~Reactions: UNKNOWN   ??? Amoxicillin UNKNOWN   ??? Bactrim [Sulfamethoxazole-Trimethoprim] UNKNOWN   ??? Cephalexin UNKNOWN   ??? Clindamycin SEE COMMENTS     Took for dental work and made gums swell   ??? Haldol [Haloperidol Lactate] SEE COMMENTS     increased psychotic activity   ??? Lamotrigine UNKNOWN   ??? Mirtazapine SEE COMMENTS     Insomnia     ??? Paliperidone UNKNOWN   ??? Phenergan [Promethazine] UNKNOWN   ??? Risperidone UNKNOWN   ??? Seroquel [Quetiapine] UNKNOWN     Review of Systems   Constitutional: Positive for fatigue, fever and unexpected weight change.   HENT: Negative for voice change. Respiratory: Negative for cough and shortness of breath.    Cardiovascular: Negative for chest pain and leg swelling.   Gastrointestinal: Positive for abdominal pain, constipation, diarrhea and nausea. Negative for blood in stool and vomiting.   Genitourinary: Positive for difficulty urinating, enuresis, menstrual problem, pelvic pain, urgency, vaginal bleeding, vaginal discharge and vaginal pain. Negative for dyspareunia, dysuria, frequency, genital sores and hematuria.   Musculoskeletal: Positive for arthralgias and back pain.   Skin: Positive for rash.   Neurological: Positive for light-headedness. Negative for headaches.   Hematological: Negative for adenopathy. Bruises/bleeds easily.   Psychiatric/Behavioral: Negative for confusion. The patient is nervous/anxious.      Objective:         ??? CARIPRAZINE HCL (VRAYLAR PO) Take 3 mg by mouth daily.   ??? diazePAM (VALIUM) 5 mg tablet Take 5 mg by mouth twice daily.   ??? dicyclomine (BENTYL) 20 mg tablet Take 10 mg by mouth four times daily.   ??? emtricitabine/tenofovir (TRUVADA) 200/300 mg tablet Take 1 tablet by mouth daily. Indications: PREVENTION OF HIV INFECTION AFTER EXPOSURE   ??? gabapentin (NEURONTIN) 600 mg tablet Take 600 mg by mouth three  times daily.   ??? hydrOXYzine (ATARAX) 25 mg tablet Take 25 mg by mouth three times daily.   ??? lithium carbonate 300 mg tablet Take 300 mg by mouth three times daily. Take with food.   ??? loratadine (CLARITIN) 10 mg tablet Take 10 mg by mouth daily.   ??? metroNIDAZOLE (FLAGYL) 500 mg tablet Take 500 mg by mouth three times daily. Take with food. Do not drink alcohol while on metronidazole.   ??? MULTIVITAMINS WITH FLUORIDE (MULTI-VITAMIN PO) Take  by mouth.   ??? MYRBETRIQ 25 mg tablet Take one tablet by mouth daily.   ??? NITROGLYCERIN PO Take  by mouth.   ??? OLANZapine (ZYPREXA) 20 mg tablet Take 20 mg by mouth twice daily.   ??? OLANZapine (ZYPREXA) 5 mg tablet Take 5 mg by mouth daily. ??? OXcarbazepine (TRILEPTAL) 600 mg tablet Take 600 mg by mouth twice daily.   ??? oxybutynin XL (DITROPAN XL) 15 mg tablet Take 15 mg by mouth daily.   ??? pentosan polysulfate sodium (ELMIRON) 100 mg capsule Take 1 capsule by mouth three times daily. Take w/ water 1 hr before or 2 hrs after meals   ??? pentosan polysulfate sodium (ELMIRON) 100 mg capsule Take 1 capsule by mouth three times daily. Take w/ water 1 hr before or 2 hrs after meals   ??? prazosin (MINIPRESS) 1 mg capsule Take 1 mg by mouth at bedtime daily.   ??? raltegravir (ISENTRESS) 400 mg tablet Take 1 tablet by mouth twice daily.   ??? rOPINIRole (REQUIP) 1 mg tablet Take 1 mg by mouth three times daily.   ??? terbinafine (LAMISIL) 250 mg tablet Take 250 mg by mouth daily.   ??? tiZANidine (ZANAFLEX) 4 mg tablet Take 4 mg by mouth twice daily (at 10AM and 10PM).   ??? traZODone (DESYREL) 100 mg tablet Take 100 mg by mouth at bedtime daily.   ??? triamcinolone acetonide (KENALOG) 0.1 % topical ointment to raised red skin only twice daily as needed, avoid face/grion/armpits     Vitals:    02/06/17 1449   BP: 117/81   Pulse: 81   Weight: 95.8 kg (211 lb 3.2 oz)   Height: 174 cm (68.5)     Body mass index is 31.65 kg/m???.     Physical Exam   Constitutional: She appears well-developed and well-nourished. No distress.   HENT:   Head: Normocephalic and atraumatic.   Pulmonary/Chest: Effort normal.   Abdominal: Soft. She exhibits no distension and no mass. There is tenderness (LLQ). There is no guarding.   Skin: She is not diaphoretic.   Psychiatric: Her mood appears anxious. Her speech is rapid and/or pressured and tangential.       Assessment and Plan:    1. Pelvic pain  US PELVIS NON OB COMP    CULTURE-URINE W/SENSITIVITY   2. Screen for STD (sexually transmitted disease)  CHLAM/NG PCR SWAB    TRICHOMONAS VAGINALIS SWAB    HIV-1/2 ANTIGEN/ANTIBODY SCREEN    SYPHILIS AB SCREEN    HEPATITIS B SURFACE AG   3. Vaginal burning  DIRECT EXAM (WET PREP) 4. Unprotected sexual intercourse     5. Late menses  BETA-HCG     Urine pregnancy test negative. Patient reports always tests negative in the urine, desires blood test.    Ultrasound ordered for pelvic pain, will treat for presumed PID/endometritis. Had recent gonorrhea that was negative. Will proceed with doxycycline.   STD Screen  RTC in 3 weeks    Fleet Contras  Hatsumi Steinhart, MD

## 2017-02-07 ENCOUNTER — Encounter
Admit: 2017-02-07 | Discharge: 2017-02-07 | Payer: MEDICAID | Primary: Student in an Organized Health Care Education/Training Program

## 2017-02-07 ENCOUNTER — Ambulatory Visit
Admit: 2017-02-07 | Discharge: 2017-02-08 | Payer: MEDICAID | Primary: Student in an Organized Health Care Education/Training Program

## 2017-02-07 DIAGNOSIS — J449 Chronic obstructive pulmonary disease, unspecified: ICD-10-CM

## 2017-02-07 DIAGNOSIS — F99 Mental disorder, not otherwise specified: ICD-10-CM

## 2017-02-07 DIAGNOSIS — IMO0001 Medical history reviewed with no changes: ICD-10-CM

## 2017-02-07 DIAGNOSIS — C801 Malignant (primary) neoplasm, unspecified: ICD-10-CM

## 2017-02-07 DIAGNOSIS — I219 Acute myocardial infarction, unspecified: ICD-10-CM

## 2017-02-07 DIAGNOSIS — K929 Disease of digestive system, unspecified: ICD-10-CM

## 2017-02-07 DIAGNOSIS — N3010 Interstitial cystitis (chronic) without hematuria: Principal | ICD-10-CM

## 2017-02-07 LAB — CHLAM/NG PCR SWAB: Lab: NEGATIVE

## 2017-02-07 LAB — SYPHILIS AB SCREEN: Lab: NEGATIVE

## 2017-02-07 LAB — HIV 1& 2 AG-AB SCRN W REFLEX HIV 1 PCR QUANT: Lab: NEGATIVE

## 2017-02-07 LAB — HEPATITIS B SURFACE AG: Lab: NEGATIVE

## 2017-02-07 MED ORDER — BACLOFEN 5 MG PO TAB
5 mg | ORAL_TABLET | Freq: Two times a day (BID) | ORAL | 4 refills | Status: AC | PRN
Start: 2017-02-07 — End: 2017-02-13

## 2017-02-07 NOTE — Progress Notes
[facility] CLINIC NOTE  Subjective     SUBJECTIVE: Adriana Tran presents in follow-up from her care regarding back and extremity.  Pain is in the bilateral lumbar region describes intermittent sharp and dull.  She also has pain in the bilateral posterior cervical region of the neck.  She presents for trigger point injections today but would like to defer them.  She was prescribed tizanidine but recalls not tolerating that medication in the past and would prefer not to take it and would like a recommendation for an terminated muscle relaxant.  She is not performed recent physical therapy and is interested in restarting that.         Review of Systems   Constitutional: Positive for appetite change, chills, diaphoresis, fatigue, fever and unexpected weight change.   HENT: Positive for congestion, dental problem, ear discharge, ear pain, mouth sores, postnasal drip, sinus pressure, sneezing, sore throat, tinnitus, trouble swallowing and voice change.    Eyes: Positive for pain and discharge.   Respiratory: Positive for apnea, cough and chest tightness.    Cardiovascular: Positive for chest pain.   Gastrointestinal: Positive for abdominal pain, constipation, diarrhea, nausea and rectal pain.   Endocrine: Positive for cold intolerance, heat intolerance and polydipsia.   Genitourinary: Positive for decreased urine volume, difficulty urinating, enuresis, frequency, menstrual problem, pelvic pain, urgency, vaginal bleeding, vaginal discharge and vaginal pain.   Musculoskeletal: Positive for arthralgias, back pain, myalgias, neck pain and neck stiffness.   Skin: Negative.    Allergic/Immunologic: Negative.    Neurological: Positive for dizziness, syncope, facial asymmetry, speech difficulty, weakness, light-headedness, numbness and headaches.   Hematological: Negative.    Psychiatric/Behavioral: Positive for agitation, behavioral problems, confusion, hallucinations and sleep disturbance. The patient is nervous/anxious and is hyperactive.    All other systems reviewed and are negative.      Current Outpatient Prescriptions:   ???  baclofen 5 mg tab, Take 5 mg by mouth twice daily as needed., Disp: 60 tablet, Rfl: 4  ???  CARIPRAZINE HCL (VRAYLAR PO), Take 3 mg by mouth daily., Disp: , Rfl:   ???  diazePAM (VALIUM) 5 mg tablet, Take 5 mg by mouth twice daily., Disp: , Rfl:   ???  dicyclomine (BENTYL) 20 mg tablet, Take 10 mg by mouth four times daily., Disp: , Rfl:   ???  doxycycline (VIBRAMYCIN) 100 mg tablet, Take one tablet by mouth twice daily for 14 days., Disp: 28 tablet, Rfl: 0  ???  emtricitabine/tenofovir (TRUVADA) 200/300 mg tablet, Take 1 tablet by mouth daily. Indications: PREVENTION OF HIV INFECTION AFTER EXPOSURE, Disp: 25 tablet, Rfl: 0  ???  gabapentin (NEURONTIN) 600 mg tablet, Take 600 mg by mouth three times daily., Disp: , Rfl:   ???  hydrOXYzine (ATARAX) 25 mg tablet, Take 25 mg by mouth three times daily., Disp: , Rfl:   ???  lithium carbonate 300 mg tablet, Take 300 mg by mouth three times daily. Take with food., Disp: , Rfl:   ???  loratadine (CLARITIN) 10 mg tablet, Take 10 mg by mouth daily., Disp: , Rfl:   ???  metroNIDAZOLE (FLAGYL) 500 mg tablet, Take 500 mg by mouth three times daily. Take with food. Do not drink alcohol while on metronidazole., Disp: , Rfl:   ???  MULTIVITAMINS WITH FLUORIDE (MULTI-VITAMIN PO), Take  by mouth., Disp: , Rfl:   ???  MYRBETRIQ 25 mg tablet, Take one tablet by mouth daily., Disp: 90 tablet, Rfl: 1  ???  NITROGLYCERIN PO, Take  by  mouth., Disp: , Rfl:   ???  OLANZapine (ZYPREXA) 20 mg tablet, Take 20 mg by mouth twice daily., Disp: , Rfl:   ???  OLANZapine (ZYPREXA) 5 mg tablet, Take 5 mg by mouth daily., Disp: , Rfl:   ???  OXcarbazepine (TRILEPTAL) 600 mg tablet, Take 600 mg by mouth twice daily., Disp: , Rfl:   ???  oxybutynin XL (DITROPAN XL) 15 mg tablet, Take 15 mg by mouth daily., Disp: , Rfl:   ???  pentosan polysulfate sodium (ELMIRON) 100 mg capsule, Take 1 capsule by mouth three times daily. Take w/ water 1 hr before or 2 hrs after meals, Disp: 90 capsule, Rfl: 11  ???  pentosan polysulfate sodium (ELMIRON) 100 mg capsule, Take 1 capsule by mouth three times daily. Take w/ water 1 hr before or 2 hrs after meals, Disp: 90 capsule, Rfl: 11  ???  prazosin (MINIPRESS) 1 mg capsule, Take 1 mg by mouth at bedtime daily., Disp: , Rfl:   ???  raltegravir (ISENTRESS) 400 mg tablet, Take 1 tablet by mouth twice daily., Disp: 50 tablet, Rfl: 0  ???  rOPINIRole (REQUIP) 1 mg tablet, Take 1 mg by mouth three times daily., Disp: , Rfl:   ???  terbinafine (LAMISIL) 250 mg tablet, Take 250 mg by mouth daily., Disp: , Rfl: 0  ???  tiZANidine (ZANAFLEX) 4 mg tablet, Take 4 mg by mouth twice daily (at 10AM and 10PM)., Disp: , Rfl:   ???  traZODone (DESYREL) 100 mg tablet, Take 100 mg by mouth at bedtime daily., Disp: , Rfl:   ???  triamcinolone acetonide (KENALOG) 0.1 % topical ointment, to raised red skin only twice daily as needed, avoid face/grion/armpits, Disp: 80 g, Rfl: 1  Allergies   Allergen Reactions   ??? Cephalosporins      Allergy recorded in SMS: Pcn/Cephalospor~Reactions: UNKNOWN   ??? Penicillins      Allergy recorded in SMS: Pcn/Cephalospor~Reactions: UNKNOWN   ??? Amoxicillin UNKNOWN   ??? Bactrim [Sulfamethoxazole-Trimethoprim] UNKNOWN   ??? Cephalexin UNKNOWN   ??? Clindamycin SEE COMMENTS     Took for dental work and made gums swell   ??? Haldol [Haloperidol Lactate] SEE COMMENTS     increased psychotic activity   ??? Lamotrigine UNKNOWN   ??? Mirtazapine SEE COMMENTS     Insomnia     ??? Paliperidone UNKNOWN   ??? Phenergan [Promethazine] UNKNOWN   ??? Risperidone UNKNOWN   ??? Seroquel [Quetiapine] UNKNOWN     Physical Exam  Vitals:    02/07/17 1424   BP: 96/62   Pulse: 84   Weight: 95.7 kg (211 lb)   Height: 174 cm (68.5)        Pain Score: Six  Body mass index is 31.62 kg/m???.  General: Alert and oriented, very pleasant female.   HEENT showed extraocular muscles were intact and no other abnormalities. Unlabored breathing.  Regular rate and rhythm on CV exam.   5/5 strength in bilateral upper and lower extremities.    Sensation is intact to light touch and equal in the upper and lower extremities.  Bilateral lumbar tenderness to palpation       IMPRESSION:  1. Epigastric pain    2. Migraine without aura and without status migrainosus, not intractable    3. Spondylosis of lumbosacral region without myelopathy or radiculopathy          PLAN: Will organized physical therapy for development of home exercise plan for core strengthening and improve muscle endurance and will  provide prescription for baclofen 5 mg p.o. twice daily as needed

## 2017-02-08 DIAGNOSIS — G43009 Migraine without aura, not intractable, without status migrainosus: ICD-10-CM

## 2017-02-08 DIAGNOSIS — M47817 Spondylosis without myelopathy or radiculopathy, lumbosacral region: ICD-10-CM

## 2017-02-08 DIAGNOSIS — R1013 Epigastric pain: Principal | ICD-10-CM

## 2017-02-08 LAB — CULTURE-URINE W/SENSITIVITY

## 2017-02-08 LAB — TRICHOMONAS VAGINALIS SWAB: Lab: NEGATIVE

## 2017-02-09 ENCOUNTER — Encounter
Admit: 2017-02-09 | Discharge: 2017-02-09 | Payer: MEDICAID | Primary: Student in an Organized Health Care Education/Training Program

## 2017-02-09 NOTE — Telephone Encounter
Pt notified  Pt v/u

## 2017-02-09 NOTE — Telephone Encounter
Pt called states calling back to find out what was told/said  to Dr regarding  Her health condition. Is on antibiotics for pelvic pain. Per pt pain, bloating for 9 years. Wants everything checked that needs to be checked.     Returned call to pt. Pt states as above, went over labs results of what was done recently. Pt v/u but states she has had recent labs done (2 weeks ago by PCP) and has elevated white blood cells, but no body is giving her answers.   Nurse advised pt to have recent labs sent to Dr. Kendrick Fries, for her review and has an upcoming appointment on 03/02/2017. Can discuss her concerns for other testing she would like to have done at this time. Pt v/u

## 2017-02-11 ENCOUNTER — Encounter
Admit: 2017-02-11 | Discharge: 2017-02-11 | Payer: MEDICAID | Primary: Student in an Organized Health Care Education/Training Program

## 2017-02-11 ENCOUNTER — Ambulatory Visit
Admit: 2017-02-11 | Discharge: 2017-02-11 | Payer: MEDICAID | Primary: Student in an Organized Health Care Education/Training Program

## 2017-02-11 DIAGNOSIS — R102 Pelvic and perineal pain: Principal | ICD-10-CM

## 2017-02-13 ENCOUNTER — Encounter
Admit: 2017-02-13 | Discharge: 2017-02-13 | Payer: MEDICAID | Primary: Student in an Organized Health Care Education/Training Program

## 2017-02-13 ENCOUNTER — Ambulatory Visit
Admit: 2017-02-13 | Discharge: 2017-02-13 | Payer: MEDICAID | Primary: Student in an Organized Health Care Education/Training Program

## 2017-02-13 DIAGNOSIS — K929 Disease of digestive system, unspecified: ICD-10-CM

## 2017-02-13 DIAGNOSIS — IMO0001 Medical history reviewed with no changes: ICD-10-CM

## 2017-02-13 DIAGNOSIS — K9289 Other specified diseases of the digestive system: Principal | ICD-10-CM

## 2017-02-13 DIAGNOSIS — J449 Chronic obstructive pulmonary disease, unspecified: ICD-10-CM

## 2017-02-13 DIAGNOSIS — I219 Acute myocardial infarction, unspecified: ICD-10-CM

## 2017-02-13 DIAGNOSIS — F99 Mental disorder, not otherwise specified: ICD-10-CM

## 2017-02-13 DIAGNOSIS — R109 Unspecified abdominal pain: ICD-10-CM

## 2017-02-13 DIAGNOSIS — C801 Malignant (primary) neoplasm, unspecified: ICD-10-CM

## 2017-02-13 DIAGNOSIS — N3010 Interstitial cystitis (chronic) without hematuria: Principal | ICD-10-CM

## 2017-02-13 MED ORDER — SODIUM CHLORIDE 0.9 % IV SOLP
INTRAVENOUS | 0 refills | Status: CN
Start: 2017-02-13 — End: ?

## 2017-02-13 NOTE — Progress Notes
Date of Service: 02/13/2017    Subjective:             Adriana Tran is a 36 y.o. female.    History of Present Illness  Follow-up visit for history of gas/bloating.  Past medical history of hernia very mild delayed gastric emptying (4th hour emptying 11%), history of small bacterial overgrowth, status post treatment with antibiotic.  Patient has history of anxiety/depression, schizophrenia, she is on polypharmacy.  Last visit we discussed the need for follow-up psychiatry at [place], she informed me she has appointment on February 2019.      Did not follow recommendation to add Dulcolax/stool softener in last visit, since that she feels that it may increase diarrhea/frequent bowel movement.    Still complaining of mild gas/bloating.  Denies weight loss, blood in stool, dysphagia, abdominal pain.         Review of Systems   Constitutional: Positive for appetite change, chills, fatigue, fever and unexpected weight change.   HENT: Positive for ear discharge, ear pain, mouth sores, postnasal drip, rhinorrhea, sinus pressure, sinus pain, sneezing, sore throat, tinnitus, trouble swallowing and voice change.    Eyes: Positive for photophobia, pain, discharge, itching and visual disturbance.   Respiratory: Positive for apnea, cough, choking, chest tightness and shortness of breath.    Cardiovascular: Positive for chest pain and palpitations.   Gastrointestinal: Positive for abdominal pain, constipation, diarrhea, nausea, rectal pain and vomiting.   Endocrine: Negative.    Genitourinary: Positive for difficulty urinating, enuresis, frequency, menstrual problem, pelvic pain, urgency and vaginal pain.   Musculoskeletal: Positive for arthralgias, back pain, myalgias, neck pain and neck stiffness.   Skin: Negative.    Allergic/Immunologic: Negative.    Neurological: Positive for dizziness, tremors, speech difficulty, weakness, light-headedness, numbness and headaches.   Hematological: Negative. Psychiatric/Behavioral: Positive for agitation, confusion, decreased concentration and hallucinations. The patient is nervous/anxious and is hyperactive.    All other systems reviewed and are negative.        Objective:         ??? baclofen 5 mg tab Take 5 mg by mouth twice daily as needed.   ??? diazePAM (VALIUM) 5 mg tablet Take 5 mg by mouth twice daily. Take 1/2 tablet every day as needed. Take 1 tables at bedtime as needed for sleep.   ??? dicyclomine (BENTYL) 20 mg tablet Take 10 mg by mouth four times daily.   ??? doxycycline (VIBRAMYCIN) 100 mg tablet Take one tablet by mouth twice daily for 14 days.   ??? emtricitabine/tenofovir (TRUVADA) 200/300 mg tablet Take 1 tablet by mouth daily. Indications: PREVENTION OF HIV INFECTION AFTER EXPOSURE   ??? gabapentin (NEURONTIN) 600 mg tablet Take 600 mg by mouth three times daily.   ??? hydrOXYzine (ATARAX) 25 mg tablet Take 25 mg by mouth three times daily as needed.   ??? lithium carbonate 300 mg tablet Take 300 mg by mouth twice daily. Take with food.    ??? loratadine (CLARITIN) 10 mg tablet Take 10 mg by mouth every morning.   ??? NITROGLYCERIN PO Take  by mouth.   ??? OLANZapine (ZYPREXA) 5 mg tablet Take 5 mg by mouth daily.   ??? OLANZAPINE PO Place 20 mg under tongue at bedtime daily.   ??? OXcarbazepine (TRILEPTAL) 600 mg tablet Take 600 mg by mouth three times daily.   ??? oxybutynin XL (DITROPAN XL) 15 mg tablet Take 15 mg by mouth daily.   ??? pentosan polysulfate sodium (ELMIRON) 100 mg capsule Take  1 capsule by mouth three times daily. Take w/ water 1 hr before or 2 hrs after meals   ??? pentosan polysulfate sodium (ELMIRON) 100 mg capsule Take 1 capsule by mouth three times daily. Take w/ water 1 hr before or 2 hrs after meals   ??? prazosin (MINIPRESS) 1 mg capsule Take 1 mg by mouth at bedtime daily.   ??? raltegravir (ISENTRESS) 400 mg tablet Take 1 tablet by mouth twice daily.   ??? rOPINIRole (REQUIP) 1 mg tablet Take 1 mg by mouth three times daily. ??? terbinafine (LAMISIL) 250 mg tablet Take 250 mg by mouth daily. take 1 for 84 days   ??? tiZANidine (ZANAFLEX) 4 mg tablet Take 4 mg by mouth twice daily (at 10AM and 10PM).   ??? traZODone (DESYREL) 50 mg tablet Take 50 mg by mouth at bedtime as needed for Sleep (1-2).     Vitals:    02/13/17 0923   BP: 118/71   Pulse: 92   Resp: 18   SpO2: 97%   Weight: 90.7 kg (200 lb)   Height: 172.7 cm (68)     Body mass index is 30.41 kg/m???.     Physical Exam   Constitutional: She appears well-developed and well-nourished.   Neurological: She is alert.            Assessment and Plan:  36 years old female with history of anxiety/depression, mild delayed gastric emptying, and small intestinal bacterial overgrowth.  Patient is on polypharmacy, large component of all her symptoms related to the underlying psychiatry disorder with perception of functional abdominal gas/bloating.    Very mild gastric emptying test (11% at fourth hour), and not completely explain all her symptoms, this could be related to polypharmacy, no effect of medication on gastric emptying.  Would recommend to continue gastroparesis diet.    Avoid all carbonated beverage, any foods can increase gas/bloating including beans.    Will reevaluate small intestinal bacterial overgrowth with repeat breath test and retreat if positive.    This is very important and I reemphasized today to the prompt, a sooner follow-up with psychiatric regarding underlying psych issue.  Gladys Damme, RN, is trying to discuss and expedite the follow-up with psychiatry with sooner appointment.    Follow-up in GI clinic as needed with Darrol Angel, ARNP.    Plan of care discussed in detail with patient, she verbalized understanding and agreed.  Total face to face time spent with patient: 30 minutes with >50% of that time spent counseling the patient on medications, prior study results related to symptoms, differential diagnosis, and options regarding the plan of care. Thank you very much for the consult, and for allowing me to participate in this patient's care.   This note was in part completed with Dragon, a Chemical engineer.  Some grammatical errors may have occurred.  If you have concerns,please contact my office for clarification.

## 2017-02-19 ENCOUNTER — Encounter
Admit: 2017-02-19 | Discharge: 2017-03-05 | Payer: MEDICAID | Primary: Student in an Organized Health Care Education/Training Program

## 2017-03-01 ENCOUNTER — Encounter
Admit: 2017-03-01 | Discharge: 2017-03-01 | Payer: MEDICAID | Primary: Student in an Organized Health Care Education/Training Program

## 2017-03-01 DIAGNOSIS — G43009 Migraine without aura, not intractable, without status migrainosus: Secondary | ICD-10-CM

## 2017-03-02 ENCOUNTER — Ambulatory Visit
Admit: 2017-03-02 | Discharge: 2017-03-03 | Payer: MEDICAID | Primary: Student in an Organized Health Care Education/Training Program

## 2017-03-02 ENCOUNTER — Encounter
Admit: 2017-03-02 | Discharge: 2017-03-02 | Payer: MEDICAID | Primary: Student in an Organized Health Care Education/Training Program

## 2017-03-02 DIAGNOSIS — N83202 Unspecified ovarian cyst, left side: ICD-10-CM

## 2017-03-02 DIAGNOSIS — J449 Chronic obstructive pulmonary disease, unspecified: ICD-10-CM

## 2017-03-02 DIAGNOSIS — K929 Disease of digestive system, unspecified: ICD-10-CM

## 2017-03-02 DIAGNOSIS — F99 Mental disorder, not otherwise specified: ICD-10-CM

## 2017-03-02 DIAGNOSIS — N3010 Interstitial cystitis (chronic) without hematuria: Principal | ICD-10-CM

## 2017-03-02 DIAGNOSIS — R102 Pelvic and perineal pain: Principal | ICD-10-CM

## 2017-03-02 DIAGNOSIS — I219 Acute myocardial infarction, unspecified: ICD-10-CM

## 2017-03-02 DIAGNOSIS — IMO0001 Medical history reviewed with no changes: ICD-10-CM

## 2017-03-02 DIAGNOSIS — C801 Malignant (primary) neoplasm, unspecified: ICD-10-CM

## 2017-03-02 NOTE — Progress Notes
Date of Service: 03/02/2017    Subjective:             Adriana Tran is a 36 y.o. female.    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   Had ASCUS HPV negative in 2018. Needs repeat in 3 years.   Ultrasound done, she declined vaginal portion. Left ovarian cyst 3.9 cm, simple in nature. Not interested in birth control. Interested in a hysterectomy for her pain.   Has abdominal pain for about 10 years. Has bloating that comes and goes. Not interested in physical therapy. Reports seen multiple doctors and nobody helps with her pain.     Has bloating and fatigue daily. Has pain at least once a week. The pain lasts minutes at a time. Feels like stabbing pain. The pain varies in location and ccurs in the stomach, pelvis, back, neck, everywhere but her nose.  In the past she has tried Aleve and it does not help with her pain.     Patient demanding for pregnancy test, urine culture, std check again today.     Past Medical History:   Diagnosis Date   ??? Cancer (HCC)     cervical ca   ??? COPD (chronic obstructive pulmonary disease) (HCC)    ??? Gastrointestinal disorder     gastroparesis   ??? IC (interstitial cystitis)    ??? Medical history reviewed with no changes    ??? Myocardial infarction [facility])     2014   ??? Psychiatric illness     Bipolar, schizophrenia, panic attacks     Past Surgical History:   Procedure Laterality Date   ??? HX CHOLECYSTECTOMY -2009  2005     Allergies   Allergen Reactions   ??? Cephalosporins      Allergy recorded in SMS: Pcn/Cephalospor~Reactions: UNKNOWN   ??? Penicillins      Allergy recorded in SMS: Pcn/Cephalospor~Reactions: UNKNOWN   ??? Amoxicillin UNKNOWN   ??? Bactrim [Sulfamethoxazole-Trimethoprim] UNKNOWN   ??? Cephalexin UNKNOWN   ??? Clindamycin SEE COMMENTS     Took for dental work and made gums swell   ??? Haldol [Haloperidol Lactate] SEE COMMENTS     increased psychotic activity   ??? Lamotrigine UNKNOWN ??? Mirtazapine SEE COMMENTS     Insomnia     ??? Paliperidone UNKNOWN   ??? Phenergan [Promethazine] UNKNOWN   ??? Risperidone UNKNOWN   ??? Seroquel [Quetiapine] UNKNOWN     Review of Systems   Constitutional: Positive for fatigue, fever and unexpected weight change.   HENT: Negative for voice change.    Respiratory: Positive for cough and shortness of breath.    Cardiovascular: Positive for chest pain. Negative for leg swelling.   Gastrointestinal: Positive for abdominal pain, constipation, diarrhea and nausea. Negative for blood in stool and vomiting.   Genitourinary: Positive for pelvic pain, vaginal discharge and vaginal pain. Negative for difficulty urinating, dyspareunia, dysuria, enuresis, frequency, genital sores, hematuria, menstrual problem, urgency and vaginal bleeding.   Musculoskeletal: Positive for arthralgias and back pain.   Skin: Positive for rash.   Neurological: Positive for light-headedness. Negative for headaches.   Hematological: Negative for adenopathy. Does not bruise/bleed easily.   Psychiatric/Behavioral: Positive for confusion. The patient is nervous/anxious.      Objective:         ??? diazePAM (VALIUM) 5 mg tablet Take 5 mg by mouth twice daily. Take 1/2 tablet  every day as needed. Take 1 tables at bedtime as needed for sleep.   ??? dicyclomine (BENTYL) 20 mg tablet Take 10 mg by mouth four times daily.   ??? emtricitabine/tenofovir (TRUVADA) 200/300 mg tablet Take 1 tablet by mouth daily. Indications: PREVENTION OF HIV INFECTION AFTER EXPOSURE   ??? gabapentin (NEURONTIN) 600 mg tablet Take 600 mg by mouth three times daily.   ??? hydrOXYzine (ATARAX) 25 mg tablet Take 25 mg by mouth three times daily as needed.   ??? lithium carbonate 300 mg tablet Take 300 mg by mouth twice daily. Take with food.    ??? loratadine (CLARITIN) 10 mg tablet Take 10 mg by mouth daily as needed.   ??? NITROGLYCERIN PO Take  by mouth.   ??? OLANZapine (ZYPREXA) 5 mg tablet Take 5 mg by mouth daily. ??? OLANZAPINE PO Place 20 mg under tongue at bedtime daily.   ??? OXcarbazepine (TRILEPTAL) 600 mg tablet Take 600 mg by mouth three times daily.   ??? oxybutynin XL (DITROPAN XL) 15 mg tablet Take 15 mg by mouth daily.   ??? pentosan polysulfate sodium (ELMIRON) 100 mg capsule Take 1 capsule by mouth three times daily. Take w/ water 1 hr before or 2 hrs after meals   ??? pentosan polysulfate sodium (ELMIRON) 100 mg capsule Take 1 capsule by mouth three times daily. Take w/ water 1 hr before or 2 hrs after meals   ??? prazosin (MINIPRESS) 1 mg capsule Take 1 mg by mouth at bedtime daily.   ??? raltegravir (ISENTRESS) 400 mg tablet Take 1 tablet by mouth twice daily.   ??? rOPINIRole (REQUIP) 1 mg tablet Take 1 mg by mouth three times daily.   ??? terbinafine (LAMISIL) 250 mg tablet Take 250 mg by mouth daily. take 1 for 84 days   ??? tiZANidine (ZANAFLEX) 4 mg tablet Take 4 mg by mouth twice daily (at 10AM and 10PM).   ??? traZODone (DESYREL) 50 mg tablet Take 50 mg by mouth at bedtime as needed for Sleep (1-2).     Vitals:    03/02/17 1032   BP: 130/80   Pulse: 91   Weight: 93.2 kg (205 lb 6.4 oz)   Height: 174 cm (68.5)     Body mass index is 30.78 kg/m???.     Physical Exam   Constitutional: She appears well-developed and well-nourished.   HENT:   Head: Normocephalic and atraumatic.   Pulmonary/Chest: Effort normal.   Neurological: She is alert.   Skin: She is not diaphoretic.   Psychiatric: Her affect is angry. Her speech is rapid and/or pressured and tangential. She is agitated.   Vitals reviewed.    Assessment and Plan:    1. Pelvic pain       Outside ultrasound 01/22/17: 4.8 cm right anechoic cyst, 3.3 cm left anechoic cyst. Ultrasound here showed 3.9 cm left cyst on 12/9.   Discussed ultrasound with patient. Likely resolution of right cyst, however, she declined pelvic portion of ultrasound and right ovary not seen. Left ovary with similar size cyst. Discussed simple cysts are common in young women and could suppress ovaries with hormonal contraception to see if this help. Discussed with patient I do not recommend surgical evaluation as the cyst will likely resolve with time. Offered repeat ultrasound, she declines and states there is always a cyst.   During visit patient had pressured and tangential speech. Was able to redirect patient at times but overall difficult to obtain accurate history. I discussed my concerns  with patient that hysterectomy would not likely resolve her pain and is a much more invasive option than hormonal contraception. Recommended she see GI for her bloating and abdominal distention which she states she has not seen them. However, per records saw on 02/13/17. Also offered her pelvic PT and declined. Patient also voiced concern during visit that she has cervical and ovarian cancer and has had many times. We discussed her ultrasound is not concerning and her recent pap smear is also not concerning for an underlying cancer. Patient left because she needed to be picked up prior to [place] confirming a final plan.  Her underlying psychiatric disorder makes it difficult to know what pain she is truly experiencing. If continues to desire hysterectomy will recommend she seek a second opinion as I do not see a medical indication to proceed at this time.     Acquanetta Belling, MD

## 2017-03-05 DIAGNOSIS — M25561 Pain in right knee: Principal | ICD-10-CM

## 2017-03-05 DIAGNOSIS — R1013 Epigastric pain: Secondary | ICD-10-CM

## 2017-03-05 DIAGNOSIS — M47817 Spondylosis without myelopathy or radiculopathy, lumbosacral region: ICD-10-CM

## 2017-03-05 DIAGNOSIS — R262 Difficulty in walking, not elsewhere classified: ICD-10-CM

## 2017-03-05 DIAGNOSIS — M25562 Pain in left knee: ICD-10-CM

## 2017-03-05 DIAGNOSIS — G43009 Migraine without aura, not intractable, without status migrainosus: Secondary | ICD-10-CM

## 2017-04-11 ENCOUNTER — Encounter
Admit: 2017-04-11 | Discharge: 2017-04-11 | Payer: MEDICAID | Primary: Student in an Organized Health Care Education/Training Program

## 2017-04-13 ENCOUNTER — Encounter
Admit: 2017-04-13 | Discharge: 2017-04-13 | Payer: MEDICAID | Primary: Student in an Organized Health Care Education/Training Program

## 2017-04-18 ENCOUNTER — Encounter
Admit: 2017-04-18 | Discharge: 2017-04-18 | Payer: MEDICAID | Primary: Student in an Organized Health Care Education/Training Program

## 2017-04-20 ENCOUNTER — Encounter
Admit: 2017-04-20 | Discharge: 2017-04-20 | Payer: MEDICAID | Primary: Student in an Organized Health Care Education/Training Program

## 2017-04-30 ENCOUNTER — Encounter
Admit: 2017-04-30 | Discharge: 2017-04-30 | Payer: MEDICAID | Primary: Student in an Organized Health Care Education/Training Program

## 2017-05-03 ENCOUNTER — Encounter
Admit: 2017-05-03 | Discharge: 2017-05-03 | Payer: MEDICAID | Primary: Student in an Organized Health Care Education/Training Program

## 2017-05-10 ENCOUNTER — Encounter
Admit: 2017-05-10 | Discharge: 2017-05-10 | Payer: MEDICAID | Primary: Student in an Organized Health Care Education/Training Program

## 2017-05-14 ENCOUNTER — Ambulatory Visit
Admit: 2017-05-14 | Discharge: 2017-05-14 | Payer: MEDICAID | Primary: Student in an Organized Health Care Education/Training Program

## 2017-05-14 ENCOUNTER — Encounter
Admit: 2017-05-14 | Discharge: 2017-05-14 | Payer: MEDICAID | Primary: Student in an Organized Health Care Education/Training Program

## 2017-05-14 DIAGNOSIS — F99 Mental disorder, not otherwise specified: ICD-10-CM

## 2017-05-14 DIAGNOSIS — K929 Disease of digestive system, unspecified: ICD-10-CM

## 2017-05-14 DIAGNOSIS — IMO0001 Medical history reviewed with no changes: ICD-10-CM

## 2017-05-14 DIAGNOSIS — N3010 Interstitial cystitis (chronic) without hematuria: Principal | ICD-10-CM

## 2017-05-14 DIAGNOSIS — J449 Chronic obstructive pulmonary disease, unspecified: ICD-10-CM

## 2017-05-14 DIAGNOSIS — N912 Amenorrhea, unspecified: Principal | ICD-10-CM

## 2017-05-14 DIAGNOSIS — C801 Malignant (primary) neoplasm, unspecified: ICD-10-CM

## 2017-05-14 DIAGNOSIS — I219 Acute myocardial infarction, unspecified: ICD-10-CM

## 2017-05-15 ENCOUNTER — Encounter
Admit: 2017-05-15 | Discharge: 2017-05-15 | Payer: MEDICAID | Primary: Student in an Organized Health Care Education/Training Program

## 2017-05-18 ENCOUNTER — Encounter
Admit: 2017-05-18 | Discharge: 2017-05-18 | Payer: MEDICAID | Primary: Student in an Organized Health Care Education/Training Program

## 2017-05-21 ENCOUNTER — Encounter
Admit: 2017-05-21 | Discharge: 2017-05-21 | Payer: MEDICAID | Primary: Student in an Organized Health Care Education/Training Program

## 2017-05-21 MED ORDER — METRONIDAZOLE 500 MG PO TAB
500 mg | ORAL_TABLET | Freq: Two times a day (BID) | ORAL | 0 refills | Status: AC
Start: 2017-05-21 — End: ?

## 2017-05-21 MED ORDER — FLUCONAZOLE 150 MG PO TAB
150 mg | ORAL_TABLET | Freq: Once | ORAL | 1 refills | 3.00000 days | Status: AC
Start: 2017-05-21 — End: ?

## 2017-06-06 ENCOUNTER — Encounter
Admit: 2017-06-06 | Discharge: 2017-06-06 | Payer: MEDICAID | Primary: Student in an Organized Health Care Education/Training Program

## 2017-06-07 ENCOUNTER — Encounter
Admit: 2017-06-07 | Discharge: 2017-06-07 | Payer: MEDICAID | Primary: Student in an Organized Health Care Education/Training Program

## 2017-06-07 DIAGNOSIS — C801 Malignant (primary) neoplasm, unspecified: ICD-10-CM

## 2017-06-07 DIAGNOSIS — I219 Acute myocardial infarction, unspecified: ICD-10-CM

## 2017-06-07 DIAGNOSIS — IMO0001 Medical history reviewed with no changes: ICD-10-CM

## 2017-06-07 DIAGNOSIS — K929 Disease of digestive system, unspecified: ICD-10-CM

## 2017-06-07 DIAGNOSIS — J449 Chronic obstructive pulmonary disease, unspecified: ICD-10-CM

## 2017-06-07 DIAGNOSIS — F99 Mental disorder, not otherwise specified: ICD-10-CM

## 2017-06-07 DIAGNOSIS — N3010 Interstitial cystitis (chronic) without hematuria: Principal | ICD-10-CM

## 2017-06-15 ENCOUNTER — Encounter
Admit: 2017-06-15 | Discharge: 2017-06-15 | Payer: MEDICAID | Primary: Student in an Organized Health Care Education/Training Program

## 2017-06-19 ENCOUNTER — Encounter
Admit: 2017-06-19 | Discharge: 2017-06-19 | Payer: MEDICAID | Primary: Student in an Organized Health Care Education/Training Program

## 2017-06-20 ENCOUNTER — Ambulatory Visit
Admit: 2017-06-20 | Discharge: 2017-06-20 | Payer: MEDICAID | Primary: Student in an Organized Health Care Education/Training Program

## 2017-06-20 ENCOUNTER — Encounter
Admit: 2017-06-20 | Discharge: 2017-06-20 | Payer: MEDICAID | Primary: Student in an Organized Health Care Education/Training Program

## 2017-06-20 DIAGNOSIS — F603 Borderline personality disorder: Principal | ICD-10-CM

## 2017-06-25 ENCOUNTER — Ambulatory Visit
Admit: 2017-06-25 | Discharge: 2017-06-25 | Payer: MEDICAID | Primary: Student in an Organized Health Care Education/Training Program

## 2017-06-27 ENCOUNTER — Encounter
Admit: 2017-06-27 | Discharge: 2017-06-27 | Payer: MEDICAID | Primary: Student in an Organized Health Care Education/Training Program

## 2017-07-03 ENCOUNTER — Emergency Department: Admit: 2017-07-03 | Discharge: 2017-07-03 | Discharge status: Against Medical Advice | Payer: MEDICAID

## 2017-07-03 ENCOUNTER — Encounter
Admit: 2017-07-03 | Discharge: 2017-07-03 | Payer: MEDICAID | Primary: Student in an Organized Health Care Education/Training Program

## 2017-07-03 ENCOUNTER — Ambulatory Visit
Admit: 2017-07-03 | Discharge: 2017-07-04 | Payer: MEDICAID | Primary: Student in an Organized Health Care Education/Training Program

## 2017-07-03 DIAGNOSIS — C801 Malignant (primary) neoplasm, unspecified: ICD-10-CM

## 2017-07-03 DIAGNOSIS — IMO0001 Medical history reviewed with no changes: ICD-10-CM

## 2017-07-03 DIAGNOSIS — F99 Mental disorder, not otherwise specified: ICD-10-CM

## 2017-07-03 DIAGNOSIS — I219 Acute myocardial infarction, unspecified: ICD-10-CM

## 2017-07-03 DIAGNOSIS — R102 Pelvic and perineal pain: Principal | ICD-10-CM

## 2017-07-03 DIAGNOSIS — Z5321 Procedure and treatment not carried out due to patient leaving prior to being seen by health care provider: Principal | ICD-10-CM

## 2017-07-03 DIAGNOSIS — K929 Disease of digestive system, unspecified: ICD-10-CM

## 2017-07-03 DIAGNOSIS — J449 Chronic obstructive pulmonary disease, unspecified: ICD-10-CM

## 2017-07-03 DIAGNOSIS — R51 Headache: ICD-10-CM

## 2017-07-03 DIAGNOSIS — N3010 Interstitial cystitis (chronic) without hematuria: Principal | ICD-10-CM

## 2017-07-04 ENCOUNTER — Encounter
Admit: 2017-07-04 | Discharge: 2017-07-04 | Payer: MEDICAID | Primary: Student in an Organized Health Care Education/Training Program

## 2017-07-16 ENCOUNTER — Encounter
Admit: 2017-07-16 | Discharge: 2017-07-16 | Payer: MEDICAID | Primary: Student in an Organized Health Care Education/Training Program

## 2017-07-20 ENCOUNTER — Encounter
Admit: 2017-07-20 | Discharge: 2017-07-20 | Payer: MEDICAID | Primary: Student in an Organized Health Care Education/Training Program

## 2017-07-23 ENCOUNTER — Encounter
Admit: 2017-07-23 | Discharge: 2017-07-23 | Payer: MEDICAID | Primary: Student in an Organized Health Care Education/Training Program

## 2017-07-27 ENCOUNTER — Encounter
Admit: 2017-07-27 | Discharge: 2017-07-27 | Payer: MEDICAID | Primary: Student in an Organized Health Care Education/Training Program

## 2017-08-01 ENCOUNTER — Encounter
Admit: 2017-08-01 | Discharge: 2017-08-01 | Payer: MEDICAID | Primary: Student in an Organized Health Care Education/Training Program

## 2017-08-01 ENCOUNTER — Ambulatory Visit
Admit: 2017-08-01 | Discharge: 2017-08-01 | Payer: MEDICAID | Primary: Student in an Organized Health Care Education/Training Program

## 2017-08-01 DIAGNOSIS — K929 Disease of digestive system, unspecified: ICD-10-CM

## 2017-08-01 DIAGNOSIS — F250 Schizoaffective disorder, bipolar type: Principal | ICD-10-CM

## 2017-08-01 DIAGNOSIS — C801 Malignant (primary) neoplasm, unspecified: ICD-10-CM

## 2017-08-01 DIAGNOSIS — IMO0001 Medical history reviewed with no changes: ICD-10-CM

## 2017-08-01 DIAGNOSIS — F411 Generalized anxiety disorder: ICD-10-CM

## 2017-08-01 DIAGNOSIS — I219 Acute myocardial infarction, unspecified: ICD-10-CM

## 2017-08-01 DIAGNOSIS — F99 Mental disorder, not otherwise specified: ICD-10-CM

## 2017-08-01 DIAGNOSIS — J449 Chronic obstructive pulmonary disease, unspecified: ICD-10-CM

## 2017-08-01 DIAGNOSIS — N3010 Interstitial cystitis (chronic) without hematuria: Principal | ICD-10-CM

## 2017-08-02 ENCOUNTER — Encounter
Admit: 2017-08-02 | Discharge: 2017-08-02 | Payer: MEDICAID | Primary: Student in an Organized Health Care Education/Training Program

## 2017-08-14 ENCOUNTER — Encounter
Admit: 2017-08-14 | Discharge: 2017-08-14 | Payer: MEDICAID | Primary: Student in an Organized Health Care Education/Training Program

## 2017-08-14 DIAGNOSIS — N3010 Interstitial cystitis (chronic) without hematuria: Principal | ICD-10-CM

## 2017-08-14 DIAGNOSIS — I219 Acute myocardial infarction, unspecified: ICD-10-CM

## 2017-08-14 DIAGNOSIS — J449 Chronic obstructive pulmonary disease, unspecified: ICD-10-CM

## 2017-08-14 DIAGNOSIS — C801 Malignant (primary) neoplasm, unspecified: ICD-10-CM

## 2017-08-14 DIAGNOSIS — K929 Disease of digestive system, unspecified: ICD-10-CM

## 2017-08-14 DIAGNOSIS — F99 Mental disorder, not otherwise specified: ICD-10-CM

## 2017-08-14 DIAGNOSIS — IMO0001 Medical history reviewed with no changes: ICD-10-CM

## 2017-08-21 ENCOUNTER — Encounter
Admit: 2017-08-21 | Discharge: 2017-08-21 | Payer: MEDICAID | Primary: Student in an Organized Health Care Education/Training Program

## 2017-08-27 ENCOUNTER — Encounter
Admit: 2017-08-27 | Discharge: 2017-08-27 | Payer: MEDICAID | Primary: Student in an Organized Health Care Education/Training Program

## 2017-08-31 ENCOUNTER — Encounter
Admit: 2017-08-31 | Discharge: 2017-09-01 | Payer: MEDICAID | Primary: Student in an Organized Health Care Education/Training Program

## 2017-08-31 ENCOUNTER — Encounter
Admit: 2017-08-31 | Discharge: 2017-08-31 | Payer: MEDICAID | Primary: Student in an Organized Health Care Education/Training Program

## 2017-08-31 ENCOUNTER — Ambulatory Visit
Admit: 2017-08-31 | Discharge: 2017-08-31 | Payer: MEDICAID | Primary: Student in an Organized Health Care Education/Training Program

## 2017-08-31 DIAGNOSIS — L298 Other pruritus: Principal | ICD-10-CM

## 2017-08-31 DIAGNOSIS — N3010 Interstitial cystitis (chronic) without hematuria: Principal | ICD-10-CM

## 2017-08-31 DIAGNOSIS — Z113 Encounter for screening for infections with a predominantly sexual mode of transmission: ICD-10-CM

## 2017-08-31 DIAGNOSIS — I219 Acute myocardial infarction, unspecified: ICD-10-CM

## 2017-08-31 DIAGNOSIS — IMO0001 Medical history reviewed with no changes: ICD-10-CM

## 2017-08-31 DIAGNOSIS — K929 Disease of digestive system, unspecified: ICD-10-CM

## 2017-08-31 DIAGNOSIS — Z0189 Encounter for other specified special examinations: ICD-10-CM

## 2017-08-31 DIAGNOSIS — J449 Chronic obstructive pulmonary disease, unspecified: ICD-10-CM

## 2017-08-31 DIAGNOSIS — N898 Other specified noninflammatory disorders of vagina: ICD-10-CM

## 2017-08-31 DIAGNOSIS — F99 Mental disorder, not otherwise specified: ICD-10-CM

## 2017-08-31 DIAGNOSIS — C801 Malignant (primary) neoplasm, unspecified: ICD-10-CM

## 2017-08-31 LAB — DIRECT EXAM (WET PREP)

## 2017-08-31 LAB — CHLAM/NG PCR SWAB
Lab: NEGATIVE
Lab: NEGATIVE

## 2017-09-01 ENCOUNTER — Encounter
Admit: 2017-09-01 | Discharge: 2017-09-01 | Payer: MEDICAID | Primary: Student in an Organized Health Care Education/Training Program

## 2017-09-01 DIAGNOSIS — N3010 Interstitial cystitis (chronic) without hematuria: Principal | ICD-10-CM

## 2017-09-01 DIAGNOSIS — I219 Acute myocardial infarction, unspecified: ICD-10-CM

## 2017-09-01 DIAGNOSIS — IMO0001 Medical history reviewed with no changes: ICD-10-CM

## 2017-09-01 DIAGNOSIS — J449 Chronic obstructive pulmonary disease, unspecified: ICD-10-CM

## 2017-09-01 DIAGNOSIS — F99 Mental disorder, not otherwise specified: ICD-10-CM

## 2017-09-01 DIAGNOSIS — K929 Disease of digestive system, unspecified: ICD-10-CM

## 2017-09-01 DIAGNOSIS — C801 Malignant (primary) neoplasm, unspecified: ICD-10-CM

## 2017-09-03 ENCOUNTER — Encounter
Admit: 2017-09-03 | Discharge: 2017-09-03 | Payer: MEDICAID | Primary: Student in an Organized Health Care Education/Training Program

## 2017-09-04 LAB — TRICHOMONAS VAGINALIS SWAB: Lab: NEGATIVE

## 2017-09-05 ENCOUNTER — Encounter
Admit: 2017-09-05 | Discharge: 2017-09-05 | Payer: MEDICAID | Primary: Student in an Organized Health Care Education/Training Program

## 2017-09-05 MED ORDER — FLUCONAZOLE 150 MG PO TAB
150 mg | ORAL_TABLET | Freq: Once | ORAL | 0 refills | 3.00000 days | Status: AC
Start: 2017-09-05 — End: ?

## 2017-09-05 MED ORDER — METRONIDAZOLE 500 MG PO TAB
500 mg | ORAL_TABLET | Freq: Two times a day (BID) | ORAL | 0 refills | Status: AC
Start: 2017-09-05 — End: ?

## 2017-09-07 ENCOUNTER — Encounter
Admit: 2017-09-07 | Discharge: 2017-09-07 | Payer: MEDICAID | Primary: Student in an Organized Health Care Education/Training Program

## 2017-09-12 ENCOUNTER — Ambulatory Visit
Admit: 2017-09-12 | Discharge: 2017-09-13 | Payer: MEDICAID | Primary: Student in an Organized Health Care Education/Training Program

## 2017-09-12 ENCOUNTER — Encounter
Admit: 2017-09-12 | Discharge: 2017-09-12 | Payer: MEDICAID | Primary: Student in an Organized Health Care Education/Training Program

## 2017-09-12 DIAGNOSIS — F99 Mental disorder, not otherwise specified: ICD-10-CM

## 2017-09-12 DIAGNOSIS — K929 Disease of digestive system, unspecified: ICD-10-CM

## 2017-09-12 DIAGNOSIS — N3010 Interstitial cystitis (chronic) without hematuria: Principal | ICD-10-CM

## 2017-09-12 DIAGNOSIS — IMO0001 Medical history reviewed with no changes: ICD-10-CM

## 2017-09-12 DIAGNOSIS — C801 Malignant (primary) neoplasm, unspecified: ICD-10-CM

## 2017-09-12 DIAGNOSIS — J449 Chronic obstructive pulmonary disease, unspecified: ICD-10-CM

## 2017-09-12 DIAGNOSIS — I219 Acute myocardial infarction, unspecified: ICD-10-CM

## 2017-09-13 DIAGNOSIS — F603 Borderline personality disorder: ICD-10-CM

## 2017-09-13 DIAGNOSIS — F411 Generalized anxiety disorder: ICD-10-CM

## 2017-09-13 DIAGNOSIS — F250 Schizoaffective disorder, bipolar type: Principal | ICD-10-CM

## 2017-09-19 ENCOUNTER — Encounter
Admit: 2017-09-19 | Discharge: 2017-09-19 | Payer: MEDICAID | Primary: Student in an Organized Health Care Education/Training Program

## 2017-09-20 ENCOUNTER — Ambulatory Visit
Admit: 2017-09-20 | Discharge: 2017-09-21 | Payer: MEDICAID | Primary: Student in an Organized Health Care Education/Training Program

## 2017-09-20 ENCOUNTER — Encounter
Admit: 2017-09-20 | Discharge: 2017-09-20 | Payer: MEDICAID | Primary: Student in an Organized Health Care Education/Training Program

## 2017-09-20 DIAGNOSIS — C801 Malignant (primary) neoplasm, unspecified: ICD-10-CM

## 2017-09-20 DIAGNOSIS — N3010 Interstitial cystitis (chronic) without hematuria: Principal | ICD-10-CM

## 2017-09-20 DIAGNOSIS — F99 Mental disorder, not otherwise specified: ICD-10-CM

## 2017-09-20 DIAGNOSIS — I219 Acute myocardial infarction, unspecified: ICD-10-CM

## 2017-09-20 DIAGNOSIS — IMO0001 Medical history reviewed with no changes: ICD-10-CM

## 2017-09-20 DIAGNOSIS — Z113 Encounter for screening for infections with a predominantly sexual mode of transmission: Secondary | ICD-10-CM

## 2017-09-20 DIAGNOSIS — J449 Chronic obstructive pulmonary disease, unspecified: ICD-10-CM

## 2017-09-20 DIAGNOSIS — K929 Disease of digestive system, unspecified: ICD-10-CM

## 2017-09-20 MED ORDER — METRONIDAZOLE 500 MG PO TAB
500 mg | ORAL_TABLET | Freq: Two times a day (BID) | ORAL | 1 refills | Status: AC
Start: 2017-09-20 — End: ?

## 2017-09-20 MED ORDER — FLUCONAZOLE 150 MG PO TAB
150 mg | ORAL_TABLET | Freq: Once | ORAL | 1 refills | 3.00000 days | Status: AC
Start: 2017-09-20 — End: ?

## 2017-09-21 ENCOUNTER — Encounter
Admit: 2017-09-21 | Discharge: 2017-09-21 | Payer: MEDICAID | Primary: Student in an Organized Health Care Education/Training Program

## 2017-09-21 DIAGNOSIS — Z113 Encounter for screening for infections with a predominantly sexual mode of transmission: Principal | ICD-10-CM

## 2017-09-21 DIAGNOSIS — N898 Other specified noninflammatory disorders of vagina: Principal | ICD-10-CM

## 2017-09-23 ENCOUNTER — Encounter
Admit: 2017-09-23 | Discharge: 2017-09-23 | Payer: MEDICAID | Primary: Student in an Organized Health Care Education/Training Program

## 2017-09-23 DIAGNOSIS — J449 Chronic obstructive pulmonary disease, unspecified: ICD-10-CM

## 2017-09-23 DIAGNOSIS — N3010 Interstitial cystitis (chronic) without hematuria: Principal | ICD-10-CM

## 2017-09-23 DIAGNOSIS — F99 Mental disorder, not otherwise specified: ICD-10-CM

## 2017-09-23 DIAGNOSIS — IMO0001 Medical history reviewed with no changes: ICD-10-CM

## 2017-09-23 DIAGNOSIS — K929 Disease of digestive system, unspecified: ICD-10-CM

## 2017-09-23 DIAGNOSIS — I219 Acute myocardial infarction, unspecified: ICD-10-CM

## 2017-09-23 DIAGNOSIS — C801 Malignant (primary) neoplasm, unspecified: ICD-10-CM

## 2017-10-04 ENCOUNTER — Ambulatory Visit
Admit: 2017-10-04 | Discharge: 2017-10-05 | Payer: MEDICAID | Primary: Student in an Organized Health Care Education/Training Program

## 2017-10-04 ENCOUNTER — Ambulatory Visit
Admit: 2017-10-04 | Discharge: 2017-10-04 | Payer: MEDICAID | Primary: Student in an Organized Health Care Education/Training Program

## 2017-10-04 ENCOUNTER — Encounter
Admit: 2017-10-04 | Discharge: 2017-10-04 | Payer: MEDICAID | Primary: Student in an Organized Health Care Education/Training Program

## 2017-10-04 DIAGNOSIS — N898 Other specified noninflammatory disorders of vagina: ICD-10-CM

## 2017-10-04 DIAGNOSIS — I219 Acute myocardial infarction, unspecified: ICD-10-CM

## 2017-10-04 DIAGNOSIS — N3010 Interstitial cystitis (chronic) without hematuria: Principal | ICD-10-CM

## 2017-10-04 DIAGNOSIS — F99 Mental disorder, not otherwise specified: ICD-10-CM

## 2017-10-04 DIAGNOSIS — C801 Malignant (primary) neoplasm, unspecified: ICD-10-CM

## 2017-10-04 DIAGNOSIS — Z113 Encounter for screening for infections with a predominantly sexual mode of transmission: Principal | ICD-10-CM

## 2017-10-04 DIAGNOSIS — K929 Disease of digestive system, unspecified: ICD-10-CM

## 2017-10-04 DIAGNOSIS — J449 Chronic obstructive pulmonary disease, unspecified: ICD-10-CM

## 2017-10-04 DIAGNOSIS — R87610 Atypical squamous cells of undetermined significance on cytologic smear of cervix (ASC-US): Secondary | ICD-10-CM

## 2017-10-04 DIAGNOSIS — IMO0001 Medical history reviewed with no changes: ICD-10-CM

## 2017-10-04 LAB — DIRECT EXAM (WET PREP)

## 2017-10-04 LAB — SYPHILIS AB SCREEN: Lab: NEGATIVE TH/uL (ref 1.00–3.30)

## 2017-10-04 LAB — HEPATITIS B SURFACE AG: Lab: NEGATIVE % (ref 15–47)

## 2017-10-04 LAB — HEPATITIS C ANTIBODY W REFLEX HCV PCR QUANT: Lab: NEGATIVE % (ref 0–2)

## 2017-10-04 LAB — HIV 1& 2 AG-AB SCRN W REFLEX HIV 1 PCR QUANT: Lab: NEGATIVE 10*3/uL (ref 140–400)

## 2017-10-05 ENCOUNTER — Encounter
Admit: 2017-10-05 | Discharge: 2017-10-05 | Payer: MEDICAID | Primary: Student in an Organized Health Care Education/Training Program

## 2017-10-05 DIAGNOSIS — N3010 Interstitial cystitis (chronic) without hematuria: Principal | ICD-10-CM

## 2017-10-05 DIAGNOSIS — K929 Disease of digestive system, unspecified: ICD-10-CM

## 2017-10-05 DIAGNOSIS — F99 Mental disorder, not otherwise specified: ICD-10-CM

## 2017-10-05 DIAGNOSIS — R87610 Atypical squamous cells of undetermined significance on cytologic smear of cervix (ASC-US): Secondary | ICD-10-CM

## 2017-10-05 DIAGNOSIS — N898 Other specified noninflammatory disorders of vagina: Principal | ICD-10-CM

## 2017-10-05 DIAGNOSIS — C801 Malignant (primary) neoplasm, unspecified: ICD-10-CM

## 2017-10-05 DIAGNOSIS — IMO0001 Medical history reviewed with no changes: ICD-10-CM

## 2017-10-05 DIAGNOSIS — I219 Acute myocardial infarction, unspecified: ICD-10-CM

## 2017-10-05 DIAGNOSIS — J449 Chronic obstructive pulmonary disease, unspecified: ICD-10-CM

## 2017-10-09 ENCOUNTER — Encounter
Admit: 2017-10-09 | Discharge: 2017-10-09 | Payer: MEDICAID | Primary: Student in an Organized Health Care Education/Training Program

## 2017-11-13 ENCOUNTER — Encounter
Admit: 2017-11-13 | Discharge: 2017-11-13 | Payer: MEDICAID | Primary: Student in an Organized Health Care Education/Training Program

## 2017-11-15 ENCOUNTER — Encounter
Admit: 2017-11-15 | Discharge: 2017-11-15 | Payer: MEDICAID | Primary: Student in an Organized Health Care Education/Training Program

## 2017-11-15 DIAGNOSIS — R140 Abdominal distension (gaseous): Principal | ICD-10-CM

## 2017-11-15 DIAGNOSIS — R143 Flatulence: ICD-10-CM

## 2017-11-15 MED ORDER — SODIUM CHLORIDE 0.9 % IV SOLP
INTRAVENOUS | 0 refills | Status: CN
Start: 2017-11-15 — End: ?

## 2017-11-16 ENCOUNTER — Ambulatory Visit
Admit: 2017-11-16 | Discharge: 2017-11-16 | Payer: MEDICAID | Primary: Student in an Organized Health Care Education/Training Program

## 2017-11-16 ENCOUNTER — Encounter
Admit: 2017-11-16 | Discharge: 2017-11-16 | Payer: MEDICAID | Primary: Student in an Organized Health Care Education/Training Program

## 2017-11-16 DIAGNOSIS — R143 Flatulence: ICD-10-CM

## 2017-12-03 ENCOUNTER — Encounter
Admit: 2017-12-03 | Discharge: 2017-12-03 | Payer: MEDICAID | Primary: Student in an Organized Health Care Education/Training Program

## 2018-01-10 ENCOUNTER — Encounter
Admit: 2018-01-10 | Discharge: 2018-01-10 | Payer: MEDICAID | Primary: Student in an Organized Health Care Education/Training Program

## 2018-01-15 ENCOUNTER — Encounter
Admit: 2018-01-15 | Discharge: 2018-01-15 | Payer: MEDICAID | Primary: Student in an Organized Health Care Education/Training Program

## 2018-02-11 ENCOUNTER — Encounter
Admit: 2018-02-11 | Discharge: 2018-02-11 | Payer: MEDICAID | Primary: Student in an Organized Health Care Education/Training Program

## 2018-02-11 DIAGNOSIS — F99 Mental disorder, not otherwise specified: ICD-10-CM

## 2018-02-11 DIAGNOSIS — IMO0001 Medical history reviewed with no changes: ICD-10-CM

## 2018-02-11 DIAGNOSIS — N3010 Interstitial cystitis (chronic) without hematuria: Principal | ICD-10-CM

## 2018-02-11 DIAGNOSIS — K929 Disease of digestive system, unspecified: ICD-10-CM

## 2018-02-11 DIAGNOSIS — I219 Acute myocardial infarction, unspecified: ICD-10-CM

## 2018-02-11 DIAGNOSIS — C801 Malignant (primary) neoplasm, unspecified: ICD-10-CM

## 2018-02-11 DIAGNOSIS — J449 Chronic obstructive pulmonary disease, unspecified: ICD-10-CM

## 2018-02-12 ENCOUNTER — Ambulatory Visit
Admit: 2018-02-11 | Discharge: 2018-02-12 | Payer: MEDICAID | Primary: Student in an Organized Health Care Education/Training Program

## 2018-02-12 DIAGNOSIS — K219 Gastro-esophageal reflux disease without esophagitis: ICD-10-CM

## 2018-02-12 DIAGNOSIS — R1084 Generalized abdominal pain: ICD-10-CM

## 2018-02-12 DIAGNOSIS — R110 Nausea: ICD-10-CM

## 2018-02-12 DIAGNOSIS — K9289 Other specified diseases of the digestive system: Principal | ICD-10-CM

## 2018-02-12 DIAGNOSIS — L909 Atrophic disorder of skin, unspecified: Secondary | ICD-10-CM

## 2018-03-12 ENCOUNTER — Encounter
Admit: 2018-03-12 | Discharge: 2018-03-12 | Payer: MEDICAID | Primary: Student in an Organized Health Care Education/Training Program

## 2018-03-18 ENCOUNTER — Encounter
Admit: 2018-03-18 | Discharge: 2018-03-18 | Payer: MEDICAID | Primary: Student in an Organized Health Care Education/Training Program

## 2018-03-21 ENCOUNTER — Encounter
Admit: 2018-03-21 | Discharge: 2018-03-21 | Payer: MEDICAID | Primary: Student in an Organized Health Care Education/Training Program

## 2018-04-09 ENCOUNTER — Encounter
Admit: 2018-04-09 | Discharge: 2018-04-09 | Payer: MEDICAID | Primary: Student in an Organized Health Care Education/Training Program

## 2018-04-09 DIAGNOSIS — R143 Flatulence: ICD-10-CM

## 2018-04-09 DIAGNOSIS — R140 Abdominal distension (gaseous): Principal | ICD-10-CM

## 2018-04-09 MED ORDER — SODIUM CHLORIDE 0.9 % IV SOLP
INTRAVENOUS | 0 refills | Status: CN
Start: 2018-04-09 — End: ?

## 2018-05-24 IMAGING — CR CHEST
1 series · 1 of 1 positions shown · non-contrast
Comparison: none

[chest port x-wise]
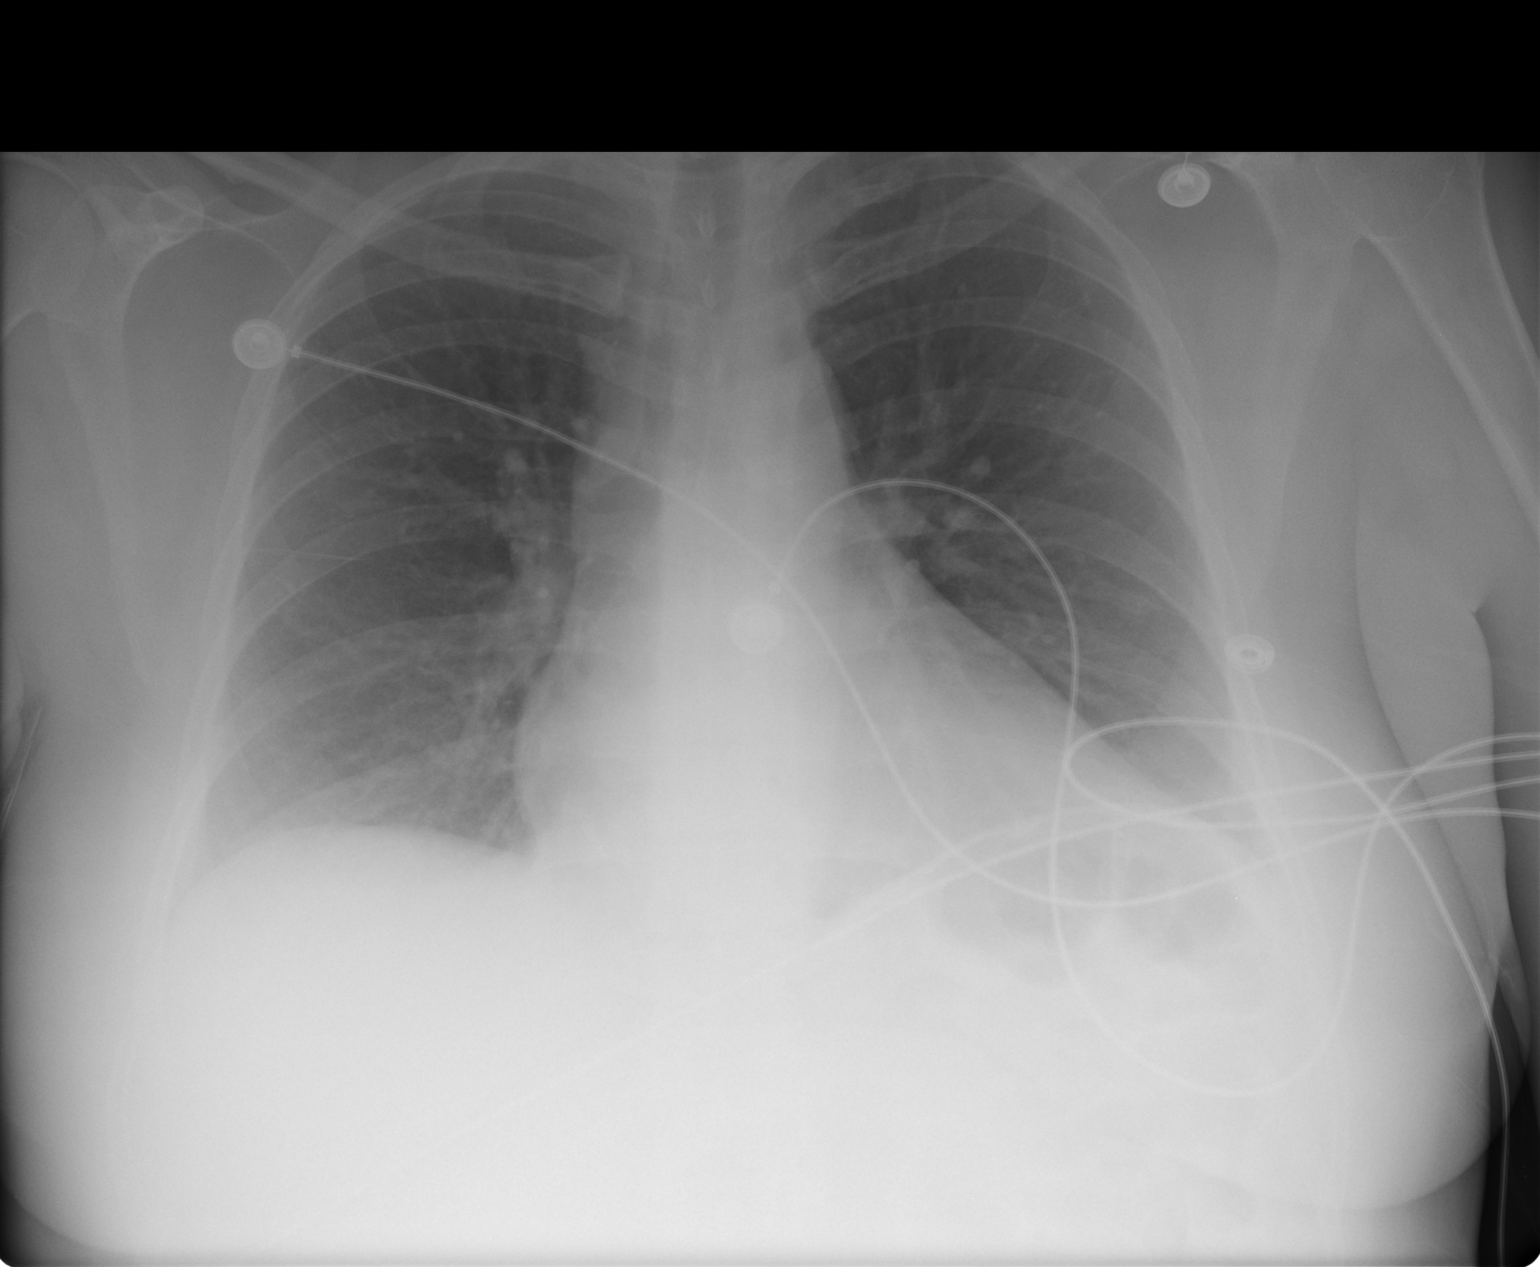

[1 of 1 positions shown; findings below may reference images not displayed]

DIAGNOSTIC STUDIES

EXAM

Portable chest

INDICATION

chest pain
C/O CHEST PAIN THAT RADIATES TO LT ARM AND FACE. PT STATES HAD SEVERE CHEST
PAIN X 2 DAYS AGO ALSO. H/O MI 6162. SMOKER. DENIES PREGNANCY. SHIELDED. HB

TECHNIQUE

COMPARISONS

None

FINDINGS

Upper abdomen normal. Heart size is upper normal. The lungs are clear.

IMPRESSION

No acute process

## 2018-05-30 ENCOUNTER — Encounter
Admit: 2018-05-30 | Discharge: 2018-05-30 | Payer: MEDICAID | Primary: Student in an Organized Health Care Education/Training Program

## 2018-08-19 ENCOUNTER — Encounter: Admit: 2018-08-19 | Discharge: 2018-08-19 | Primary: Student in an Organized Health Care Education/Training Program

## 2018-08-20 ENCOUNTER — Encounter: Admit: 2018-08-20 | Discharge: 2018-08-20 | Primary: Student in an Organized Health Care Education/Training Program

## 2018-08-20 ENCOUNTER — Ambulatory Visit
Admit: 2018-08-20 | Discharge: 2018-08-20 | Payer: MEDICAID | Primary: Student in an Organized Health Care Education/Training Program

## 2018-08-20 DIAGNOSIS — IMO0001 Medical history reviewed with no changes: Secondary | ICD-10-CM

## 2018-08-20 DIAGNOSIS — R143 Flatulence: ICD-10-CM

## 2018-08-20 DIAGNOSIS — I219 Acute myocardial infarction, unspecified: Secondary | ICD-10-CM

## 2018-08-20 DIAGNOSIS — N3010 Interstitial cystitis (chronic) without hematuria: Secondary | ICD-10-CM

## 2018-08-20 DIAGNOSIS — R140 Abdominal distension (gaseous): Principal | ICD-10-CM

## 2018-08-20 DIAGNOSIS — F99 Mental disorder, not otherwise specified: Secondary | ICD-10-CM

## 2018-08-20 DIAGNOSIS — K929 Disease of digestive system, unspecified: Secondary | ICD-10-CM

## 2018-08-20 DIAGNOSIS — C801 Malignant (primary) neoplasm, unspecified: Secondary | ICD-10-CM

## 2018-08-20 DIAGNOSIS — J449 Chronic obstructive pulmonary disease, unspecified: Secondary | ICD-10-CM

## 2018-08-29 ENCOUNTER — Encounter: Admit: 2018-08-29 | Discharge: 2018-08-29 | Primary: Student in an Organized Health Care Education/Training Program

## 2018-08-29 NOTE — Telephone Encounter
Pt called for results of H Breath test. Results attained, Pt notified, she is frustrated that there aren't any answers. Pt ranted for a while about various things for approx 15 min.

## 2018-09-24 ENCOUNTER — Encounter: Admit: 2018-09-24 | Discharge: 2018-09-24 | Primary: Student in an Organized Health Care Education/Training Program

## 2018-10-04 ENCOUNTER — Encounter: Admit: 2018-10-04 | Discharge: 2018-10-04 | Primary: Student in an Organized Health Care Education/Training Program

## 2018-10-15 ENCOUNTER — Ambulatory Visit: Admit: 2018-10-15 | Discharge: 2018-10-15 | Primary: Student in an Organized Health Care Education/Training Program

## 2018-10-15 ENCOUNTER — Encounter: Admit: 2018-10-15 | Discharge: 2018-10-15 | Primary: Student in an Organized Health Care Education/Training Program

## 2018-10-15 DIAGNOSIS — I219 Acute myocardial infarction, unspecified: Secondary | ICD-10-CM

## 2018-10-15 DIAGNOSIS — A749 Chlamydial infection, unspecified: Secondary | ICD-10-CM

## 2018-10-15 DIAGNOSIS — Z7689 Persons encountering health services in other specified circumstances: Secondary | ICD-10-CM

## 2018-10-15 DIAGNOSIS — F99 Mental disorder, not otherwise specified: Secondary | ICD-10-CM

## 2018-10-15 DIAGNOSIS — A549 Gonococcal infection, unspecified: Secondary | ICD-10-CM

## 2018-10-15 DIAGNOSIS — C801 Malignant (primary) neoplasm, unspecified: Secondary | ICD-10-CM

## 2018-10-15 DIAGNOSIS — B977 Papillomavirus as the cause of diseases classified elsewhere: Secondary | ICD-10-CM

## 2018-10-15 DIAGNOSIS — IMO0001 Medical history reviewed with no changes: Secondary | ICD-10-CM

## 2018-10-15 DIAGNOSIS — J449 Chronic obstructive pulmonary disease, unspecified: Secondary | ICD-10-CM

## 2018-10-15 DIAGNOSIS — K929 Disease of digestive system, unspecified: Secondary | ICD-10-CM

## 2018-10-15 DIAGNOSIS — N3010 Interstitial cystitis (chronic) without hematuria: Secondary | ICD-10-CM

## 2018-10-15 LAB — HIV 1& 2 AG-AB SCRN W REFLEX HIV 1 PCR QUANT

## 2018-10-15 LAB — HEPATITIS C ANTIBODY W REFLEX HCV PCR QUANT

## 2018-10-15 LAB — SYPHILIS AB SCREEN: Lab: NEGATIVE

## 2018-10-15 NOTE — Progress Notes
Date of Service: 10/15/2018    Subjective:             Adriana Tran is a 38 y.o. female.    History of Present Illness    Adriana Tran returns today with concerns about STD exposure.  She is a 38 yo with unknown parity, LMP one week ago.   She had sexual intercourse one week ago and is concerned her female partner removed his condom.  This is her only sexual partner in the past year.  Since this act of intercourse she has had her menses along with unusual brown discharge.  Denies vulvar pruritis, however has noted some vulvar pain that is intermittent.      Also at the beginning of her cycle she had an episode of rectal bleeding.  Has not had this problem previously, believes she has hemorrhoids.        PMH, family and social history reviewed from prior notes.  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   PCP is Dr. Margarito Courser in [place].         Review of Systems   Constitutional: Positive for fatigue, fever and unexpected weight change.   HENT: Negative.    Eyes: Negative.    Respiratory: Negative.  Negative for cough and shortness of breath.    Cardiovascular: Positive for chest pain. Negative for leg swelling.   Gastrointestinal: Positive for anal bleeding. Negative for abdominal pain, blood in stool, constipation, diarrhea, nausea and vomiting.   Endocrine: Negative.    Genitourinary: Positive for difficulty urinating, dyspareunia, frequency, menstrual problem and vaginal discharge. Negative for dysuria, enuresis, genital sores, hematuria, pelvic pain, urgency, vaginal bleeding and vaginal pain.   Musculoskeletal: Negative.  Negative for arthralgias and back pain.   Skin: Positive for rash.   Allergic/Immunologic: Negative.    Neurological: Positive for headaches. Negative for light-headedness.   Hematological: Negative.  Negative for adenopathy. Does not bruise/bleed easily.   Psychiatric/Behavioral: Negative for confusion. The patient is nervous/anxious. All other systems reviewed and are negative.        Objective:         ??? benztropine (COGENTIN) 1 mg tablet Take 1 mg by mouth twice daily.   ??? dicyclomine (BENTYL) 10 mg capsule Take 10 mg by mouth four times daily.   ??? gabapentin (NEURONTIN) 600 mg tablet Take 600 mg by mouth three times daily.   ??? lithium carbonate (ESKALITH) 150 mg capsule Take 300 mg by mouth twice daily with meals. Take with food.    ??? loratadine (CLARITIN) 10 mg tablet Take 10 mg by mouth every morning.   ??? omeprazole DR (PRILOSEC) 20 mg capsule Take 20 mg by mouth daily before breakfast.   ??? pentosan polysulfate sodium (ELMIRON) 100 mg capsule Take 100 mg by mouth three times daily before meals.   ??? traZODone (DESYREL) 100 mg tablet Take 100 mg by mouth at bedtime daily.   ??? traZODone (DESYREL) 50 mg tablet Take 100 mg by mouth at bedtime daily.     Vitals:    10/15/18 0833   BP: 94/62   BP Source: Arm, Right Upper   Patient Position: Sitting   Pulse: 62   Resp: 20   Temp: 36.4 ???C (97.5 ???F)   TempSrc: Oral   Weight: 88.2 kg (194 lb 6.4 oz)   Height: 174 cm (68.5)   PainSc: Four     Body mass index is 29.13 kg/m???.  Physical Exam  Vitals signs and nursing note reviewed. Exam conducted with a chaperone present.   Constitutional:       Appearance: Normal appearance.   HENT:      Head: Normocephalic and atraumatic.   Eyes:      Conjunctiva/sclera: Conjunctivae normal.   Pulmonary:      Effort: Pulmonary effort is normal.   Genitourinary:     Labia:         Right: No rash, tenderness or lesion.         Left: No rash, tenderness or lesion.       Vagina: No vaginal discharge, erythema, tenderness or bleeding.      Cervix: No discharge, friability, lesion or cervical bleeding.      Rectum: No external hemorrhoid.   Skin:     General: Skin is warm and dry.   Neurological:      General: No focal deficit present.      Mental Status: She is alert and oriented to person, place, and time.   Psychiatric: Mood and Affect: Mood is anxious. Affect is inappropriate.         Speech: Speech is rapid and pressured and tangential.         Behavior: Behavior is hyperactive.              Assessment and Plan:    Normal gynecological exam today.  Will test for HIV, syphilis, GC/CT and hepatitis C.  Encouraged to use condoms consistently to avoid exposure to STDs.   Fungal culture also done.  Will contact patient with results.       Encouraged pt to follow up with her PCP concerning recent episode of rectal bleeding.  A copy of today's note was faxed to Dr. Nadean Corwin.

## 2018-10-15 NOTE — Patient Instructions
General Instructions  To Contact Me:   Please send a MyChart message to Ob/Gyn or call [phone] to reach your doctor???s nurse team.  To Have Medication Refilled:   Please use the MyChart Refill request or contact your pharmacy directly to request medication refills. Please allow 72 hours.  To Receive Your Test Results:   You will receive your test results in person at your appointment, or by MyChart if you are signed up. We prefer to discuss test results during your appointment time, so please try to have your labs done several days before your next routine visit. If unable to discuss in person, your results will either be sent to you via MyChart, by telephone, or by letter. If you are expecting results and have not heard from my office within 2 weeks of your testing, please send a MyChart message or call my office.  Our Lab (Location, Hours, and Fasting Information)  Lab Location: the 1st floor of the Medical Office Building, directly to the left of the Information Desk.  Lab Hours: Monday 6:30 am-6 pm, Tuesday-Friday 7 am-6 pm, and Saturday 7 am-noon.   Fasting for Lab: For fasting labs, please:   Do not eat for at least 8-10 hours before having your labs drawn, drink plenty of water, and make sure to continue your medications as prescribed (unless otherwise specified).  Radiology is on the 2nd floor of the Medical Office Building.  To Schedule an Appointment:   Contact our schedulers at [phone] and select 1. If it has been over a year since your last appointment, please ask for an annual or yearly physical appointment.   Receive Appointment Reminders on Your Cell Phone:   Make sure we have your cell phone number, and text [place] to [phone].  For Urgent Questions:   On nights, weekends or holidays, call the operator at [phone], and ask for the Ob/Gyn on call.   For emergencies, call 911.    We value your feedback and you may receive a survey about your experience with our office. If you had a favorable experience, the only positive survey results that we will receive are if we are rated in the 9 or 10 range out of 10 points.

## 2018-10-16 LAB — CHLAM/NG PCR SWAB: Lab: NEGATIVE

## 2018-10-18 ENCOUNTER — Encounter: Admit: 2018-10-18 | Discharge: 2018-10-18 | Primary: Student in an Organized Health Care Education/Training Program

## 2018-10-18 LAB — CULTURE-FUNGAL,OTHER

## 2018-10-18 MED ORDER — TERCONAZOLE 80 MG VA SUPP
1 | Freq: Every evening | VAGINAL | 0 refills | Status: AC
Start: 2018-10-18 — End: ?

## 2018-10-18 NOTE — Telephone Encounter
I called patient and left a voice mail.  Her fungal culture is positive for C. glabrata.   Will treat with Terazol, see scripts.

## 2018-10-22 ENCOUNTER — Encounter: Admit: 2018-10-22 | Discharge: 2018-10-22 | Payer: MEDICAID

## 2018-10-22 DIAGNOSIS — B379 Candidiasis, unspecified: Secondary | ICD-10-CM

## 2018-10-22 MED ORDER — FLUCONAZOLE 150 MG PO TAB
150 mg | ORAL_TABLET | Freq: Once | ORAL | 0 refills | 3.00000 days | Status: AC
Start: 2018-10-22 — End: ?

## 2018-10-22 NOTE — Telephone Encounter
Order placed.

## 2018-10-22 NOTE — Telephone Encounter
Pt left V/M stating she is calling for the results of her testing that was done on 10/15/2018.    Returned call to pt. pt notified of results per note from Dr.Ault ( positive for C. glabrata.   Will treat with Terazol, see scripts)    Advised pt that a script has been sent in, pt states she does not want anything other than pills, does not want to use a suppository or insert anything into her vagina.  Advised pt I would send a message to Dr.Ault for recommendations.

## 2018-10-25 ENCOUNTER — Encounter: Admit: 2018-10-25 | Discharge: 2018-10-25 | Primary: Student in an Organized Health Care Education/Training Program

## 2018-10-25 NOTE — Telephone Encounter
Pt called left v/m stating Results has not called back. Bye.    Call placed --spoke with Pt --advised lab results negative and that her fungal culture showed positive for yeast infection (C. Glabrata) and treated with Terazol (see Scripts)  And a script for Fluconazole was sent also.

## 2018-10-25 NOTE — Telephone Encounter
Done,  See phone message 8/18

## 2018-12-13 ENCOUNTER — Encounter
Admit: 2018-12-13 | Discharge: 2018-12-13 | Payer: MEDICAID | Primary: Student in an Organized Health Care Education/Training Program

## 2018-12-13 DIAGNOSIS — B379 Candidiasis, unspecified: Secondary | ICD-10-CM

## 2018-12-16 MED ORDER — FLUCONAZOLE 150 MG PO TAB
ORAL_TABLET | Freq: Once | 0 refills
Start: 2018-12-16 — End: ?

## 2019-01-10 IMAGING — CR ABDOMEN
3 series · 3 of 3 positions shown · non-contrast
Comparison: none

[chest pa]
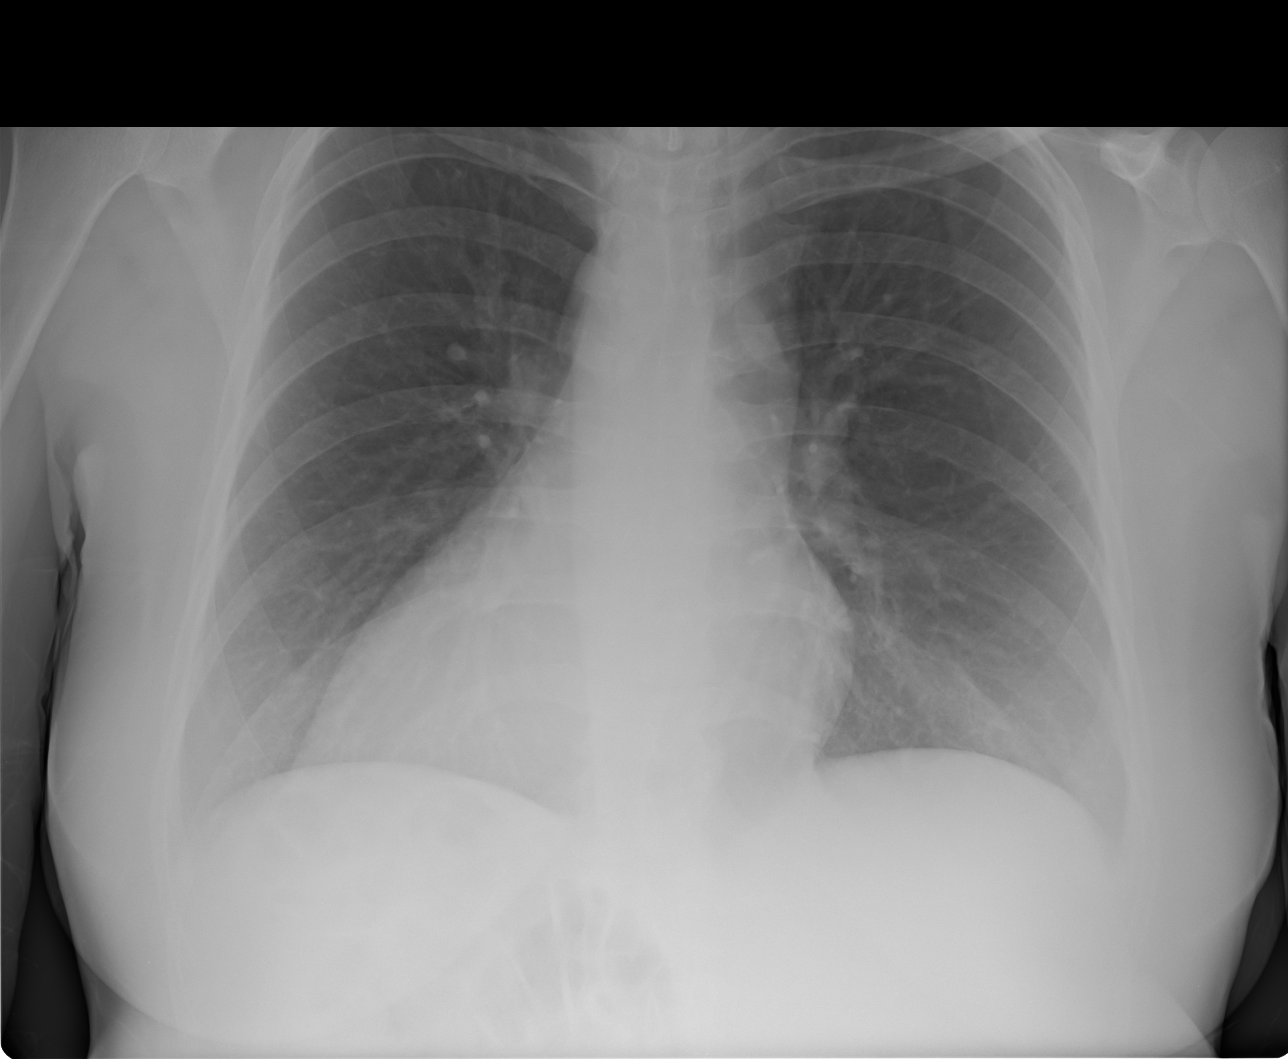

[abdomen port]
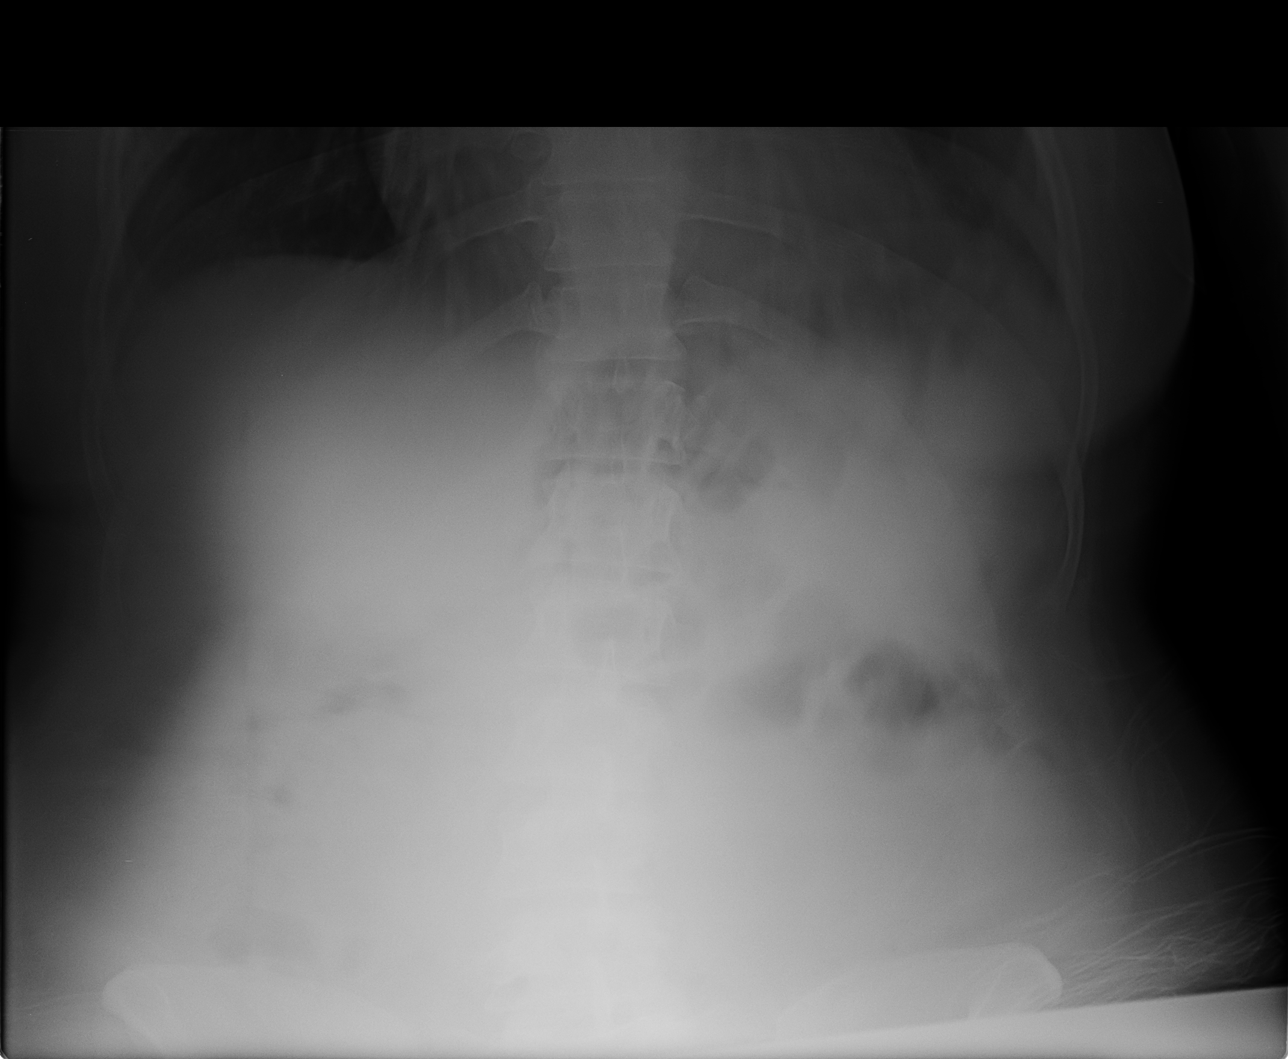

[abdomen supine kub]
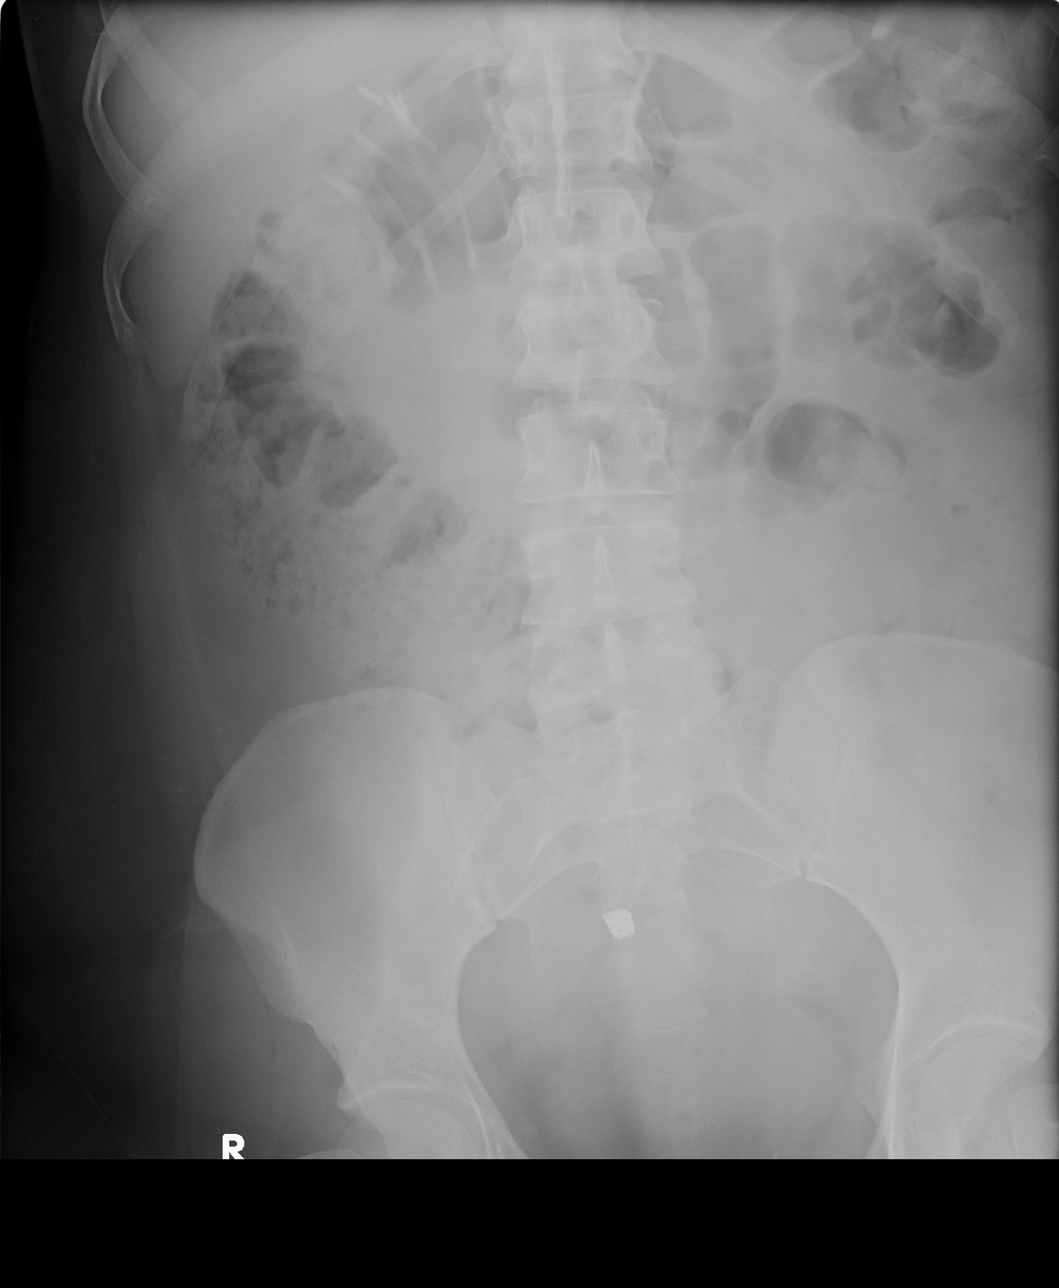

[3 of 3 positions shown; findings below may reference images not displayed]

08/07/17

DIAGNOSTIC STUDIES

EXAM
XR abdominal obstructive series with PA series

INDICATION
abdomenal pain, psychosis
C/O CHEST AND ABD PAIN. NAUSEA. PT REFUSED TO REMOVE NECLACES. PT HAD ZIPPER ON JUMPSUIT, UNABLE TO
REMOVE FOR IMAGING.

TECHNIQUE
Frontal radiograph of the chest and frontal upright and supine radiographs of the abdomen

COMPARISONS
Chest x-ray December 19, 2016 abdominal May 24, 2015

FINDINGS
The heart size is normal and the lungs are clear. Calcified granulomas noted. There is no free
intraperitoneal gas on the upright view. The bowel gas is nonspecific nonobstructive.

IMPRESSION
No acute cardiopulmonary abnormality, radiographic evidence of bowel obstruction, or free
intraperitoneal gas.

Tech Notes:

C/O CHEST AND ABD PAIN. NAUSEA. PT REFUSED TO REMOVE NECLACES. PT HAD ZIPPER ON JUMPSUIT, UNABLE TO
REMOVE FOR IMAGING.

## 2019-05-22 ENCOUNTER — Encounter
Admit: 2019-05-22 | Discharge: 2019-05-22 | Payer: MEDICAID | Primary: Student in an Organized Health Care Education/Training Program

## 2019-05-22 NOTE — Telephone Encounter
Pt called requesting an appointment with Dr. Ebony Hail. Called pt back, phone number for scheduling given.

## 2019-05-28 ENCOUNTER — Encounter
Admit: 2019-05-28 | Discharge: 2019-05-28 | Payer: MEDICAID | Primary: Student in an Organized Health Care Education/Training Program

## 2019-06-09 ENCOUNTER — Encounter
Admit: 2019-06-09 | Discharge: 2019-06-09 | Payer: MEDICAID | Primary: Student in an Organized Health Care Education/Training Program

## 2019-07-10 ENCOUNTER — Encounter
Admit: 2019-07-10 | Discharge: 2019-07-10 | Payer: MEDICAID | Primary: Student in an Organized Health Care Education/Training Program

## 2019-07-10 NOTE — Telephone Encounter
Pt called, left VM that she missed her appt because her transportation service did not pick her up. Pt wanting to re-schedule her appt. Pt has appt scheduled on 5/11 @ 1000 with Dr. Wynonia Lawman & 5/11 @ 320 with Dr. Lew Dawes. Pt has seen Dr. Wynonia Lawman & needs appt to be telephone. Told her we would cancel Dr. Lew Dawes appt for same date. Will call pt on 5/11 as scheduled.

## 2019-07-15 ENCOUNTER — Encounter
Admit: 2019-07-15 | Discharge: 2019-07-15 | Payer: MEDICAID | Primary: Student in an Organized Health Care Education/Training Program

## 2019-07-15 ENCOUNTER — Ambulatory Visit
Admit: 2019-07-15 | Discharge: 2019-07-15 | Payer: MEDICAID | Primary: Student in an Organized Health Care Education/Training Program

## 2019-07-15 DIAGNOSIS — A749 Chlamydial infection, unspecified: Secondary | ICD-10-CM

## 2019-07-15 DIAGNOSIS — R1012 Left upper quadrant pain: Secondary | ICD-10-CM

## 2019-07-15 DIAGNOSIS — B977 Papillomavirus as the cause of diseases classified elsewhere: Secondary | ICD-10-CM

## 2019-07-15 DIAGNOSIS — J449 Chronic obstructive pulmonary disease, unspecified: Secondary | ICD-10-CM

## 2019-07-15 DIAGNOSIS — F99 Mental disorder, not otherwise specified: Secondary | ICD-10-CM

## 2019-07-15 DIAGNOSIS — K929 Disease of digestive system, unspecified: Secondary | ICD-10-CM

## 2019-07-15 DIAGNOSIS — I219 Acute myocardial infarction, unspecified: Secondary | ICD-10-CM

## 2019-07-15 DIAGNOSIS — C801 Malignant (primary) neoplasm, unspecified: Secondary | ICD-10-CM

## 2019-07-15 DIAGNOSIS — Z20822 Encounter for screening laboratory testing for COVID-19 virus in asymptomatic patient: Secondary | ICD-10-CM

## 2019-07-15 DIAGNOSIS — IMO0001 Medical history reviewed with no changes: Secondary | ICD-10-CM

## 2019-07-15 DIAGNOSIS — A549 Gonococcal infection, unspecified: Secondary | ICD-10-CM

## 2019-07-15 DIAGNOSIS — N3010 Interstitial cystitis (chronic) without hematuria: Secondary | ICD-10-CM

## 2019-07-17 ENCOUNTER — Encounter
Admit: 2019-07-17 | Discharge: 2019-07-17 | Payer: MEDICAID | Primary: Student in an Organized Health Care Education/Training Program

## 2019-07-17 NOTE — Telephone Encounter
Called pt to see where she could get the Covid 19 test done in [place] prior to her procedure. Pt states it took 1.5 weeks to get results from health department & she will not go to the hospital there. Pt will ask PCP when she goes in a few weeks & let me know where to send her orders.

## 2019-08-22 ENCOUNTER — Encounter
Admit: 2019-08-22 | Discharge: 2019-08-22 | Payer: MEDICAID | Primary: Student in an Organized Health Care Education/Training Program

## 2019-08-22 NOTE — Telephone Encounter
Patient left urgent VMX states needs her annual exam sooner then August. Having pelvic and butt pain also blood in urine. States she should not have to wait for her annual exam.    Rtd call to patient states she has had pelvic and pain in rectum. Advised if having issues can schedule in [facility] for evaluation. Also advised last pap smear and HPV testing were normal. Apt scheduled, patient states she needs 3 days notice for transportation. Location give, patient states she is ok to see a female provider.

## 2019-08-28 ENCOUNTER — Encounter: Admit: 2019-08-28 | Discharge: 2019-08-28 | Payer: MEDICAID

## 2019-08-28 ENCOUNTER — Ambulatory Visit: Admit: 2019-08-28 | Discharge: 2019-08-29 | Payer: MEDICAID

## 2019-08-28 DIAGNOSIS — IMO0001 Medical history reviewed with no changes: Secondary | ICD-10-CM

## 2019-08-28 DIAGNOSIS — A749 Chlamydial infection, unspecified: Secondary | ICD-10-CM

## 2019-08-28 DIAGNOSIS — B977 Papillomavirus as the cause of diseases classified elsewhere: Secondary | ICD-10-CM

## 2019-08-28 DIAGNOSIS — M7918 Myalgia, other site: Secondary | ICD-10-CM

## 2019-08-28 DIAGNOSIS — J449 Chronic obstructive pulmonary disease, unspecified: Secondary | ICD-10-CM

## 2019-08-28 DIAGNOSIS — C801 Malignant (primary) neoplasm, unspecified: Secondary | ICD-10-CM

## 2019-08-28 DIAGNOSIS — K929 Disease of digestive system, unspecified: Secondary | ICD-10-CM

## 2019-08-28 DIAGNOSIS — I219 Acute myocardial infarction, unspecified: Secondary | ICD-10-CM

## 2019-08-28 DIAGNOSIS — A549 Gonococcal infection, unspecified: Secondary | ICD-10-CM

## 2019-08-28 DIAGNOSIS — F99 Mental disorder, not otherwise specified: Secondary | ICD-10-CM

## 2019-08-28 DIAGNOSIS — R310 Gross hematuria: Secondary | ICD-10-CM

## 2019-08-28 DIAGNOSIS — N3010 Interstitial cystitis (chronic) without hematuria: Secondary | ICD-10-CM

## 2019-08-28 DIAGNOSIS — R102 Pelvic and perineal pain: Secondary | ICD-10-CM

## 2019-08-28 LAB — URINALYSIS, MICROSCOPIC

## 2019-08-28 NOTE — Progress Notes
Date of Service: 08/28/2019    Subjective:             Adriana Tran is a 39 y.o. female.    History of Present Illness  This patient is a 39 year old who presents for evaluation of pelvic pain.  She states that recently pain in the lower abdomen pelvis has been flaring.  She presently rates it at an 8 out of 10.  She cannot describe the nature of the pain to me.  She says it just hurts.  Not eating seems to trigger her pain.  She has not taken anything for the pain.  She denies any fevers or chills.  She feels like she has been experiencing some gross hematuria over the last couple weeks but does not feel that anyone is taking her complaints seriously.  She has not had any recent pelvic imaging.  The pain does not radiate outside of the lower abdomen and pelvis.  She states she has a diagnosis of ulcerative colitis and interstitial cystitis and takes Elmiron for the IC.  She has had these issues for many years but it seems to be flaring in intensity recently.         Review of Systems   Constitutional: Negative for fatigue, fever and unexpected weight change.   HENT: Negative for voice change.    Respiratory: Negative for cough and shortness of breath.    Cardiovascular: Negative for chest pain and leg swelling.   Gastrointestinal: Negative for abdominal pain, blood in stool, constipation, diarrhea, nausea and vomiting.   Endocrine: Negative for heat intolerance.   Genitourinary: Negative for difficulty urinating, dyspareunia, dysuria, enuresis, frequency, genital sores, hematuria, menstrual problem, pelvic pain, urgency, vaginal bleeding, vaginal discharge and vaginal pain.   Musculoskeletal: Negative for arthralgias and back pain.   Skin: Negative for rash.   Allergic/Immunologic: Negative for environmental allergies, food allergies and immunocompromised state.   Neurological: Negative for light-headedness and headaches.   Hematological: Negative for adenopathy. Does not bruise/bleed easily. Psychiatric/Behavioral: Negative for confusion. The patient is not nervous/anxious.          Medical History:   Diagnosis Date   ? Cancer (HCC)     cervical ca   ? Chlamydia    ? COPD (chronic obstructive pulmonary disease) (HCC)    ? Gastrointestinal disorder     gastroparesis   ? Gonorrhea    ? HPV (human papilloma virus) infection    ? IC (interstitial cystitis)    ? Medical history reviewed with no changes    ? Myocardial infarction [facility])     2014   ? Psychiatric illness     Bipolar, schizophrenia, panic attacks     Surgical History:   Procedure Laterality Date   ? HX CHOLECYSTECTOMY  2005   ? COLONOSCOPY N/A 07/29/2014    Performed by Raynelle Chary, MD at [facility] ENDO   ? ESOPHAGEAL PH CAPSULE PLACEMENT N/A 07/29/2014    Performed by Raynelle Chary, MD at [facility] ENDO   ? HYDROGEN BREATH TEST N/A 08/31/2015    Performed by Michelene Heady, MBBS at [facility] ENDO   ? BREATH HYDROGEN/ METHANE TESTING N/A 08/20/2018    Performed by Eliott Nine, MD at [facility] ENDO     Family History   Problem Relation Age of Onset   ? Other Maternal Grandfather         kidney failure   ? None Reported Mother    ? None Reported Father  Social History     Socioeconomic History   ? Marital status: Single     Spouse name: Not on file   ? Number of children: Not on file   ? Years of education: Not on file   ? Highest education level: Not on file   Occupational History   ? Not on file   Tobacco Use   ? Smoking status: Former Smoker     Packs/day: 2.00     Types: Cigarettes     Quit date: 06/25/2017     Years since quitting: 2.1   ? Smokeless tobacco: Never Used   ? Tobacco comment: approx quit date   Substance and Sexual Activity   ? Alcohol use: No     Alcohol/week: 0.0 standard drinks     Comment: occ   ? Drug use: No   ? Sexual activity: Not Currently     Partners: Male   Other Topics Concern   ? Not on file   Social History Narrative   ? Not on file                     Objective:         ? benztropine (COGENTIN) 1 mg tablet Take 1 mg by mouth twice daily. ? dicyclomine (BENTYL) 10 mg capsule Take 10 mg by mouth four times daily.   ? gabapentin (NEURONTIN) 600 mg tablet Take 600 mg by mouth three times daily.   ? lithium carbonate (ESKALITH) 150 mg capsule Take 300 mg by mouth twice daily with meals. Take with food.    ? loratadine (CLARITIN) 10 mg tablet Take 10 mg by mouth every morning.   ? omeprazole DR (PRILOSEC) 20 mg capsule Take 20 mg by mouth daily before breakfast.   ? pentosan polysulfate sodium (ELMIRON) 100 mg capsule Take 100 mg by mouth three times daily before meals.   ? terconazole (TERAZOL 3) 80 mg vaginal suppository Insert or Apply one suppository to vaginal area at bedtime daily.   ? traZODone (DESYREL) 100 mg tablet Take 100 mg by mouth at bedtime daily.   ? traZODone (DESYREL) 50 mg tablet Take 100 mg by mouth at bedtime daily.     Vitals:    08/28/19 0922   BP: 98/70   BP Source: Arm, Right Upper   Patient Position: Sitting   Pulse: 64   Temp: 36.1 ?C (97 ?F)   TempSrc: Oral   Weight: 89.2 kg (196 lb 9.6 oz)   Height: 174 cm (68.5)   PainSc: Eight     Body mass index is 29.45 kg/m?Marland Kitchen     Physical Exam  Vital signs reviewed.  CONSTITUTIONAL: Healthy appearing female.  Alert and oriented x 3, and in no acute distress.  EYES: No scleral icterus, EOMI.  ENT:  Normal range of motion.    CV:  Regular rate.  RESP:  Normal effort, unlabored breathing.  ABDOMEN:  Soft and non-distended.  No mass, rebound, or guarding.  moderate tenderness to palpation located in LLQ.  positive Carnett's sign.  BACK:  no CVA tenderness, mild paraspinal tenderness.  no hip tenderness.  PELVIC:  Normal appearing external female genitalia.  Normal vaginal epithelium without abnormal lesions or discharge. Normal appearing cervix. Uterus is small, mobile, and tender. No palpable adnexal masses.  No anoperineal lesions.  moderate tenderness with palpation of levator ani muscles.  moderate tenderness with palpation of obturator internus muscles.  This does not duplicate the bulk of pain  symptoms.  SKIN:  Warm, dry.  No concerning lesions.  EXTREMITIES: No edema.  PSYCH: Appropriate mood and affect.       Assessment and Plan:  Chronic pelvic pain  Hematuria    We are going to send urinalysis and urine culture.  I am going to obtain a pelvic ultrasound.  The etiology of the pain is unclear but likely multifactorial.  We will see what we turn up on labs and imaging and go from there.    Cherie Dark, MD

## 2019-08-28 NOTE — Patient Instructions
Call [phone] to schedule pelvic US.

## 2019-08-29 DIAGNOSIS — G8929 Other chronic pain: Secondary | ICD-10-CM

## 2019-09-01 ENCOUNTER — Encounter: Admit: 2019-09-01 | Discharge: 2019-09-01 | Payer: MEDICAID

## 2019-09-01 NOTE — Telephone Encounter
-----   Message from Cherie Dark, MD sent at 08/29/2019  3:11 PM CDT -----  Please let the patient know there is no blood in her urine and no evidence of UTI.  AF

## 2019-09-01 NOTE — Telephone Encounter
Spoke with patient. Patient was notified of results. Patient verbalized understanding

## 2019-09-17 ENCOUNTER — Ambulatory Visit: Admit: 2019-09-17 | Discharge: 2019-09-17 | Payer: MEDICAID

## 2019-09-17 ENCOUNTER — Encounter: Admit: 2019-09-17 | Discharge: 2019-09-17 | Payer: MEDICAID

## 2019-09-17 DIAGNOSIS — R102 Pelvic and perineal pain: Secondary | ICD-10-CM

## 2019-09-25 ENCOUNTER — Encounter: Admit: 2019-09-25 | Discharge: 2019-09-25 | Payer: MEDICAID

## 2019-09-25 NOTE — Telephone Encounter
Pt called stating she did not have directions for her EGD on 7/28. Informed pt that we mailed them to her house, she doesn't know where they are. She is asking for them to be re-mailed & states that she isn't getting her mail. Asked if she had an e-mail & she does [email or web address]. Pt also given verbal instructions, mailed to e-mail & mailed to her home per pt request. Discussed if she was Covid vaccinated & she is not. Asked her where she can get tested in [place], pt states she has no idea but will not go to the local hospital or to the health department. I will search to find somewhere in are for her.

## 2019-09-30 ENCOUNTER — Encounter: Admit: 2019-09-30 | Discharge: 2019-10-01 | Payer: MEDICAID

## 2019-10-01 ENCOUNTER — Encounter: Admit: 2019-10-01 | Discharge: 2019-10-01 | Payer: MEDICAID

## 2019-10-01 DIAGNOSIS — Z20822 Contact with and (suspected) exposure to covid-19: Secondary | ICD-10-CM

## 2019-10-01 LAB — COVID-19 (SARS-COV-2) PCR

## 2019-10-01 NOTE — Telephone Encounter
18 mins on phone with patient answering questions and providing reassurance.  Patient will discuss with pre-procedure team any further questions.

## 2019-10-01 NOTE — Telephone Encounter
At the request of the GI Lab staff to call patient to discuss questions regarding procedure today after speaking with Unit Coordinator Elon Jester, attempt made by clinic and call went straight to voice mail.  Only able to leave message for patient to call if our clinic may help with questions regarding procedure scheduled for today.  Understand patient has already spoken with GI Lab UC today.  More than happy to further assist if patient still with questions.

## 2019-10-09 ENCOUNTER — Encounter: Admit: 2019-10-09 | Discharge: 2019-10-09 | Payer: MEDICAID

## 2019-10-09 NOTE — Telephone Encounter
Spoke with patient. Patient said she called to discuss a possible cyst removal with Dr. Natasha Mead. Advised patient that she has an appointment scheduled with Dr. Natasha Mead on 8/31 to discuss her options. Patient stated that she needs to speak with him sooner to understand the procedure because it could kill her. Patient requested a sooner appointment at the [facility] office only. Advised patient there where no open appointments before hers on 8/31. Advised patient that Dr. Natasha Mead is not in the office this week, but I would send him a message and let him know patient is requesting a call from him. Patient verbalized understanding.

## 2019-10-09 NOTE — Telephone Encounter
Patient left message stating that she wanted to schedule her surgery to get a cyst removed .    Returned patient call. Patient stated that she was at [facility] and would like me to call back later. Advised patient that I would attempt to call her back later this afternoon. Patient verbalized understanding.

## 2019-10-10 ENCOUNTER — Encounter: Admit: 2019-10-10 | Discharge: 2019-10-10 | Payer: MEDICAID

## 2019-10-10 DIAGNOSIS — Z20822 Encounter for screening laboratory testing for COVID-19 virus in asymptomatic patient: Secondary | ICD-10-CM

## 2019-10-10 DIAGNOSIS — R1012 Left upper quadrant pain: Secondary | ICD-10-CM

## 2019-10-10 NOTE — Telephone Encounter
Called pt, left VM to inform her the pre-anesthesia department has agreed to meet with her at 1:50 on 10/16/19. This will be a one time appt & will not be re-scheduled.

## 2019-10-10 NOTE — Telephone Encounter
Called pt regarding appt scheduled on 11/12/19. Pt voiced shedid not have procedure done because she had questions about what medications that will be used for sedation & no one would answer her questions. Pt given phone # to pre-anesthesia to have discussion regarding what medications would be used & answer her questions. Pt states she will re-schedule procedure after talking with them. Pt states she is seeing another doctor who said she has some cysts & they will be taking it out.She said it was suppose to help her  Told pt we would re-schedule her appt after she re-schedules her procedure & have results. Appt for 11/12/19 cancelled.

## 2019-10-13 ENCOUNTER — Ambulatory Visit: Admit: 2019-10-13 | Discharge: 2019-10-13 | Payer: MEDICAID

## 2019-10-13 DIAGNOSIS — R1012 Left upper quadrant pain: Secondary | ICD-10-CM

## 2019-10-14 ENCOUNTER — Encounter: Admit: 2019-10-14 | Discharge: 2019-10-14 | Payer: MEDICAID

## 2019-10-27 ENCOUNTER — Encounter: Admit: 2019-10-27 | Discharge: 2019-10-27 | Payer: MEDICAID

## 2019-10-27 NOTE — Telephone Encounter
Pt called stating she doesn't feel comfortable with Dr. Wynonia Lawman & they do not communicate well. She is requesting a different provider. Message sent to Lindalou Hose.

## 2019-11-27 ENCOUNTER — Encounter: Admit: 2019-11-27 | Discharge: 2019-11-27 | Payer: MEDICAID

## 2020-02-04 ENCOUNTER — Encounter: Admit: 2020-02-04 | Discharge: 2020-02-04 | Payer: MEDICAID

## 2020-02-18 ENCOUNTER — Encounter: Admit: 2020-02-18 | Discharge: 2020-02-18 | Payer: MEDICAID

## 2020-02-18 NOTE — Telephone Encounter
Patient called with concerns of missing urgent clinic visit for vaginal bleeding and discharge. Patient only wants to be seen by Dr. Herbert Deaner. Scheduled in next available clinic opening:  Future Appointments   Date Time Provider [place]   04/21/2020  8:00 AM Mack Guise, MD [facility] OB/GYN   04/27/2020 11:30 AM Charmian Muff, MD [facility] OB/GYN

## 2020-03-04 ENCOUNTER — Encounter: Admit: 2020-03-04 | Discharge: 2020-03-04 | Payer: MEDICAID

## 2020-03-04 NOTE — Telephone Encounter
Pt left a VM stating the she was calling about her future appointments. She missed an appointment in urgent clinic with Dr.Hecker and then was told that she was rescheduled for February. however, she still has an appointment with Dr.Mancillas the week prior to seeing Dr.Hecker. She was trying to figure out what appointments were needed.    Called back but no answer. VM left.

## 2020-03-08 ENCOUNTER — Encounter: Admit: 2020-03-08 | Discharge: 2020-03-08 | Payer: MEDICAID

## 2020-03-08 NOTE — Telephone Encounter
This pt left a vm msg stating that she has already argues with someone prior to this msg and doesn't want to argue with anyone else about it but she was specifically told to see Dr. Elmer Picker for an appt first then Dr. Starleen Arms afterwards for her physical and that isn't the way it is scheduled.  This nurse reviewed chart, it looks like a nurse had originally scheduled her for one of our UC appts, that just so happened to be run by Dr. Elmer Picker that day for her urgent needs and then was scheduled to see Dr. Starleen Arms for her physical at a later time, but pt no showed or cancelled that UC appt so when she called to reschedule with Dr. Elmer Picker they scheduled her appt after her scheduled appt with Dr. Starleen Arms.  This nurse called pt and attempted to explain the issue, but the pt started speaking about several different topics including her mental health issues and medications, transportation issues, medicaid issues, rectal bleeding(which she states she needs to call her PCP to discuss), then went on to discuss her chest pain, but her cardiologist will be mad since she hasn't done something she was supposed to do, (didn't understand what she was saying), but how she has 7 appts she hasn't been able to keep and needs to reschedule but medicaid transportation is terrible and if she gets a dependable ride she has to pay and has no money.  Pt went on for about 20 min, I finally was able to get her to stop and listen and let her know she was scheduled for an appt to see Dr. Elmer Picker and asked if date and time was good, she said yes that this would give her plenty of time to arrange a ride.  I then let her know I needed to let her go and she said okay.

## 2020-03-08 NOTE — Telephone Encounter
Patient left a voicemail upset that her upcoming appointments are scheduled first with Dr.Mancillas and then Dr.Hecker. She wishes to keep both appointments but wants to schedule her appointment with Dr.Mancillas after her appointment with D.Hecker "like she was told".     I will forward this call to Mancilla's nurse to change this appointment.

## 2020-03-10 ENCOUNTER — Encounter: Admit: 2020-03-10 | Discharge: 2020-03-10 | Payer: MEDICAID

## 2020-03-10 NOTE — Telephone Encounter
Patient left VMX about appt on the 16th.     Attempted to rtn call to patient, no answer. LMTRC

## 2020-03-11 ENCOUNTER — Encounter: Admit: 2020-03-11 | Discharge: 2020-03-11 | Payer: MEDICAID

## 2020-03-11 NOTE — Telephone Encounter
Patient left VMX about her appointment.     Rtd call to patient advised she has an appointment on 2/7 w/ Dr. Ernesta Amble. Patient v/u, no further questions.

## 2020-04-12 ENCOUNTER — Encounter: Admit: 2020-04-12 | Discharge: 2020-04-12 | Payer: MEDICAID

## 2020-04-12 ENCOUNTER — Ambulatory Visit: Admit: 2020-04-12 | Discharge: 2020-04-13 | Payer: MEDICAID

## 2020-04-12 DIAGNOSIS — B977 Papillomavirus as the cause of diseases classified elsewhere: Secondary | ICD-10-CM

## 2020-04-12 DIAGNOSIS — F99 Mental disorder, not otherwise specified: Secondary | ICD-10-CM

## 2020-04-12 DIAGNOSIS — Z3200 Encounter for pregnancy test, result unknown: Secondary | ICD-10-CM

## 2020-04-12 DIAGNOSIS — C801 Malignant (primary) neoplasm, unspecified: Secondary | ICD-10-CM

## 2020-04-12 DIAGNOSIS — N3010 Interstitial cystitis (chronic) without hematuria: Secondary | ICD-10-CM

## 2020-04-12 DIAGNOSIS — J449 Chronic obstructive pulmonary disease, unspecified: Secondary | ICD-10-CM

## 2020-04-12 DIAGNOSIS — I219 Acute myocardial infarction, unspecified: Secondary | ICD-10-CM

## 2020-04-12 DIAGNOSIS — A749 Chlamydial infection, unspecified: Secondary | ICD-10-CM

## 2020-04-12 DIAGNOSIS — IMO0001 Medical history reviewed with no changes: Secondary | ICD-10-CM

## 2020-04-12 DIAGNOSIS — K929 Disease of digestive system, unspecified: Secondary | ICD-10-CM

## 2020-04-12 DIAGNOSIS — A549 Gonococcal infection, unspecified: Secondary | ICD-10-CM
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
## 2020-04-13 ENCOUNTER — Ambulatory Visit: Admit: 2020-04-13 | Discharge: 2020-04-13 | Payer: MEDICAID

## 2020-04-13 DIAGNOSIS — Z01419 Encounter for gynecological examination (general) (routine) without abnormal findings: Principal | ICD-10-CM

## 2020-04-13 DIAGNOSIS — Z202 Contact with and (suspected) exposure to infections with a predominantly sexual mode of transmission: Secondary | ICD-10-CM

## 2020-04-13 LAB — CHLAM/NG PCR SWAB
Lab: NEGATIVE
Lab: NEGATIVE

## 2020-04-13 LAB — DIRECT EXAM (WET PREP)

## 2020-04-13 LAB — THIN PREP HOLD LABEL

## 2020-04-13 LAB — TRICHOMONAS VAGINALIS SWAB: Lab: NEGATIVE K/UL (ref 0–0.45)

## 2020-04-15 NOTE — Telephone Encounter
-----   Message from Mack Guise, MD sent at 04/14/2020  4:05 PM CST -----  TV: negative  CT/NG: negative  Wet mount: negative    Please notify of negative results, Thanks.

## 2020-04-15 NOTE — Telephone Encounter
Advised pt of results, pt v/u, c/o vaginal irritation, educated on vulvar skin care, asked about cervical cancer screening, advised it was still in process will call once resulted. Pt v/u

## 2020-04-20 NOTE — Telephone Encounter
Patient left VMX asking about medical records for surgery.    Rtd call to patient states she was transferred to speak to "Levada Dy" about having her records sent so she can have surgery. Asked patient what surgery she is having, states she is "trying to schedule a surgery to have cysts removed to relieve some of the pain she is having. Surgery would be with the "GI doctor". Advised patient this "Levada Dy" does not handle medical records, phone# given for hospital release of information to obtain the records she would need, explained patient would need to have a release signed for the records to be sent. Patient confirmed phone#, no further questions.

## 2020-04-20 NOTE — Telephone Encounter
Pt called and left vxm stating she has a mammogram and lab work today and would like to know if she can eat and take her medication before her appt.     Rtd call, adv pt that she can eat and drink and take her medications before her mammogram appt and lab work. Pt v/u. Adv pt to also not use deodorant or lotion before mammogram per instructions from mammogram clinic. Pt v/u.

## 2020-04-21 NOTE — Telephone Encounter
Spoke with Dr Kerrie Pleasure nurse and advised pt had contacted [place] yesterday and seemed very confused.  She stated that pt appears that way very often but they would follow up.

## 2020-04-26 ENCOUNTER — Encounter

## 2020-04-26 DIAGNOSIS — Z01419 Encounter for gynecological examination (general) (routine) without abnormal findings: Secondary | ICD-10-CM

## 2020-04-26 DIAGNOSIS — Z202 Contact with and (suspected) exposure to infections with a predominantly sexual mode of transmission: Secondary | ICD-10-CM

## 2020-04-26 DIAGNOSIS — Z3200 Encounter for pregnancy test, result unknown: Secondary | ICD-10-CM

## 2020-04-26 LAB — HEPATITIS C ANTIBODY W REFLEX HCV PCR QUANT

## 2020-04-26 LAB — BETA-HCG: Lab: <1 U/L (ref <5)

## 2020-04-26 LAB — HIV 1& 2 AG-AB SCRN W REFLEX HIV 1 PCR QUANT

## 2020-04-26 LAB — SYPHILIS AB SCREEN: Lab: NEGATIVE

## 2020-04-28 ENCOUNTER — Encounter: Admit: 2020-04-28 | Discharge: 2020-04-28 | Payer: MEDICAID

## 2020-04-30 ENCOUNTER — Encounter: Admit: 2020-04-30 | Discharge: 2020-04-30 | Payer: MEDICAID

## 2020-04-30 NOTE — Telephone Encounter
Patient had called several times yesterday to ask about results of pap and mammogram. I called patient several times yesterday and today with no answer. I then called her back now and gave her the results.  Patient was frustrated with the previous nurses that she interacted with and I tried to calm her down and tell that she couldn't yell and cuss at our nurses on the phone. I told her that I wanted to help her and get her taken care of, as she is still complaining of itchiness in her vagina and all over her body. I asked if she wanted me to talk to her provider again about next steps, but she stated she would get a lawyer and didn't want to come back to see [place] again, since our nurses didn't want to talk to her. I apologized and asked if there was anything else I could do to help her and she said no.    Beverley Fiedler, RN, BSN

## 2020-05-05 ENCOUNTER — Encounter: Admit: 2020-05-05 | Discharge: 2020-05-05 | Payer: MEDICAID

## 2020-06-07 ENCOUNTER — Encounter: Admit: 2020-06-07 | Discharge: 2020-06-07 | Payer: MEDICAID

## 2020-06-07 NOTE — Telephone Encounter
Pt. inquiring if she can be seen sooner then 6/8. LVM [id number]

## 2020-06-07 NOTE — Telephone Encounter
Phone call from patient, states she is having chest pain.  Pt states this pain started yesterday. Pt is having shortness of breath, patient denies sweating, nausea. Advised that she needs to be evaluated in the ED, patient is refusing at this time. Patient states she has an appt with her PCP at 4pm today and she will discuss it then. Establish care appt scheduled for patient.

## 2020-06-08 ENCOUNTER — Encounter: Admit: 2020-06-08 | Discharge: 2020-06-08 | Payer: MEDICAID

## 2020-06-08 NOTE — Telephone Encounter
Patient left vmx requesting a call back.     Attempted to return patient's call, no answer left vmx advising her to call back if needed.
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
## 2020-06-09 ENCOUNTER — Encounter: Admit: 2020-06-09 | Discharge: 2020-06-09 | Payer: MEDICAID

## 2020-06-09 ENCOUNTER — Ambulatory Visit: Admit: 2020-06-09 | Discharge: 2020-06-09 | Payer: MEDICAID

## 2020-06-09 DIAGNOSIS — K929 Disease of digestive system, unspecified: Secondary | ICD-10-CM

## 2020-06-09 DIAGNOSIS — A549 Gonococcal infection, unspecified: Secondary | ICD-10-CM

## 2020-06-09 DIAGNOSIS — B977 Papillomavirus as the cause of diseases classified elsewhere: Secondary | ICD-10-CM

## 2020-06-09 DIAGNOSIS — R1084 Generalized abdominal pain: Principal | ICD-10-CM

## 2020-06-09 DIAGNOSIS — J449 Chronic obstructive pulmonary disease, unspecified: Secondary | ICD-10-CM

## 2020-06-09 DIAGNOSIS — C801 Malignant (primary) neoplasm, unspecified: Secondary | ICD-10-CM

## 2020-06-09 DIAGNOSIS — F99 Mental disorder, not otherwise specified: Secondary | ICD-10-CM

## 2020-06-09 DIAGNOSIS — A749 Chlamydial infection, unspecified: Secondary | ICD-10-CM

## 2020-06-09 DIAGNOSIS — I219 Acute myocardial infarction, unspecified: Secondary | ICD-10-CM

## 2020-06-09 DIAGNOSIS — N3010 Interstitial cystitis (chronic) without hematuria: Secondary | ICD-10-CM

## 2020-06-09 DIAGNOSIS — IMO0001 Medical history reviewed with no changes: Secondary | ICD-10-CM

## 2020-06-09 LAB — CBC AND DIFF
Lab: 0 % (ref 0–2)
Lab: 0 % (ref 0–5)
Lab: 0 K/UL (ref 0–0.20)
Lab: 0 K/UL (ref 0–0.45)
Lab: 0.4 K/UL (ref 0–0.80)
Lab: 1.8 K/UL (ref 1.0–4.8)
Lab: 11 g/dL — ABNORMAL LOW (ref 12.0–15.0)
Lab: 14 % (ref 11–15)
Lab: 278 K/UL (ref 150–400)
Lab: 28 % (ref 24–44)
Lab: 3.6 M/UL — ABNORMAL LOW (ref 4.0–5.0)
Lab: 30 pg (ref 26–34)
Lab: 32 g/dL (ref 32.0–36.0)
Lab: 33 % — ABNORMAL LOW (ref 36–45)
Lab: 4.3 K/UL (ref 1.8–7.0)
Lab: 6.7 K/UL (ref 4.5–11.0)
Lab: 65 % (ref 41–77)
Lab: 7 % (ref 4–12)
Lab: 8.7 FL (ref 7–11)
Lab: 92 FL (ref 80–100)

## 2020-06-09 LAB — C REACTIVE PROTEIN (CRP): Lab: 0.1 mg/dL (ref <1.0)

## 2020-06-09 NOTE — Patient Instructions
Adriana Tran, it was nice talking with you today. I have summarized the recommendations made for you by Dr. Wynonia Lawman or Dr. Lew Dawes today. If you have any questions please send Darl Pikes a message in your MyChart or you may phone her at [phone].     1. We have ordered labs for you, please have this done on your way out today.    2. Low FODMAP Diet    A low FODMAP diet may reduce symptoms caused by irritable bowel syndrome (IBS), inflammatory bowel disease (Crohn?s disease and ulcerative colitis), small intestinal bacterial overgrowth (SIBO), a slow-moving gut, and other bowel disorders. Symptoms of FODMAP intolerance include gas, abdominal discomfort, distention, bloating, fullness, nausea, and/or pain after eating foods containing FODMAPs.  FODMAPs are a type of carbohydrate (sugars and fibers) found in certain foods. These sugars are poorly absorbed and may cause digestive distress in some people. Unabsorbed FODMAPs pull extra water into the intestines, which can make you feel bloated and can cause diarrhea. In addition, bacteria in the intestine ferment FODMAPs which makes gas.     FODMAP stands for the following:    F ? Fermentable ? Quickly broken down by bacteria in the gut and produce gas  O ? Oligosaccharides (FOS, Fructans and GOS, Galactans)- Found in select vegetables, legumes, fruits, grains, nuts and teas. Humans do not have the enzymes to break these down, so they cannot be absorbed in the small intestine. This leads to rapid fermentation in the large intestine by the microbiota, causing gas formation.  D ? Disaccharides (Lactose) ? Found in select milk and milk products. Many IBS sufferers lack the enzyme lactase needed to break lactose down lactose to its monosaccharide constituents (glucose and galactose).  M ? Monosaccharides (Fructose) ? Found in select fruits, vegetables and sweeteners. Fructose is a single sugar molecule which some people have difficulty absorbing in large amounts.  A ? And  P ? Polyols (Sugar Alcohols) ? Found in select fruits, vegetables and artificial sweeteners ending in -ol. (sorbitol, mannitol, xylitol, and maltitol). Sugar alcohols are slowly and incompletely absorbed by humans and often have a laxative effect      Elimination Phase (6-8 weeks)    The low FODMAP diet is an elimination diet that involves removing high FODMAP foods from the diet to assess whether FODMAP rich foods are triggering your GI symptoms. It is a learning diet rather than one that you stay on forever. The goal of the diet is to help you determine your personal dietary triggers. Many people will find they can liberalize their FODMAP diet restrictions and only need to restrict some high FODMAP foods.     The low FODMAP diet should be implemented with the help of a dietitian to help you navigate the diet and develop a personalized, well-balanced eating plan. If you are doing an elimination diet, eliminate all high FODMAP foods for 2-8 weeks using the food lists provided. Buying one of the books listed in the resources section is helpful for following the diet. Keep a food and symptom diary during all phases of the diet.   Reintroduction Phase (8 weeks)    After the low FODMAP elimination diet phase, re-introduce FODMAPs in a methodical manner to assess your tolerance to various FODMAP containing foods.     How to Reintroduce FODMAPs:  Test one FODMAP group at a time (one group per week) and keep avoiding other FODMAPs. Test with foods that only contain one type of FODMAP (see chart below). Eat a ?  normal? amount of the test food (don?t over-do it). Try to test on 3 days during the week. Use the same test food each of the 3 days, but increase amounts by half (for example ? cup to 1 cup to 1 ? cups). Track your symptoms every day during the challenge week (not just on the challenge day). You may use the food and symptom log in the back of this handout.  Challenge Week What to Eat on Challenge Days   1. Lactose ? cup of milk, Or ? cup plain yogurt (without high fructose corn syrup or other FODMAPs)   2. Fructose ? mango, Or 1-2 teaspoons honey   3. Polyols ? sorbitol 2 dried apricots, Or 1 nectarine   4. Polyols - mannitol  ? cup mushrooms, Or 1/3 cup cauliflower   5. Fructans ? wheat 1-2 slices white sourdough bread, Or 2 slices of whole wheat bread, Or, 1 cup cooked pasta   6. Fructans ? onion 1 Tbsp diced onion   7. Fructans ? garlic 1 clove of garlic   8. GOS ? cup kidney beans, lima beans, or soybeans     Food Group Serving Size and Suggestions Low FODMAP  (OK TO EAT) Moderate FODMAP   (LIMIT) High FODMAP (AVOID)   Fruits ? cup of cut fruit or a medium (baseball size) whole fruit.   Limit to 1 to 2 servings per day.   Fresh or fresh frozen fruit may be better tolerated than canned fruit.   Tolerance may depend on the amount you eat at one time.   Limit concentrated sources of fruit, such as dried fruit  Bananas   Blueberries   Cantaloupe   Grapefruit   Grapes   Honeydew   Kiwi   Lemons   Limes   Oranges   Papaya   Passion fruit   Pineapple   Raspberries   Rhubarb   Strawberries   Tangelos   Note: Avoid eating large amounts of any fruit.  Canned fruit          Apples   Applesauce   Avocados   Blackberries   Dried fruits (e.g., raisins, dates)   Fruit juice   Lychees   Pears   Persimmons   Watermelons   Stone fruits:   Apricots   Cherries   Mangos   Nectarines   Peaches   Plums   Prunes    Vegetables ? cup for most vegetables or 1 cup of leafy greens   Limit to 1? to 3 servings per day.   Cooked vegetables may be tolerated best since cooking causes a loss of free sugars.   Keep in mind tolerance may depend on the amount you eat at one time.  Bamboo shoots   Bok choy   Carrots   Celery   Chives   Cucumber   Eggplant   Green beans   Kale   Lettuce   Parsnips   Pumpkin   Radish   Red bell pepper   Spinach   Squash   Sweet potato   Turnip   White potato   Zucchini      Corn   Green peas   Tomatoes        Artichokes   Asparagus Beets   Broccoli   Brussels sprouts   Cabbage   Cauliflower   Fennel   Garlic   Green bell peppers   Leeks   Mushrooms   Okra   Onions   Shallots  Sweet corn   Tomato paste    Dairy Hard or aged cheeses are better tolerated than softer cheeses. Choose lactose-free dairy products Kefir   Lactose-free milk Lactose-free cottage cheese   Lactose-free yogurt   Hard or aged cheeses Butter   Cream   Cream cheese  American cheese    Milk   Yogurt   Ice cream   Cottage cheese   Ricotta cheese              Grains Low FODMAP is also a gluten-free diet Quinoa   Rice   Millet   Cornmeal   Gluten-free products  Oats   Buckwheat   Sourdough white bread  Wheat   Barley   Rye    Legumes and Nuts     Firm and medium tofu   Pumpkin seeds   Sesame seeds   Sunflower seeds  Canned and drained chickpeas and lentils     Nuts and nut butters except pistachios and cashews     Flax seeds    Soy (silken tofu, textured vegetable protein, edamame, soy nuts, soy milk)     Beans   Chickpeas hummus   Lentils   Pistachios   Cashews    Beverages  Espresso   Filtered coffee   Green tea   Peppermint tea   Black tea   Soft drinks that include high-fructose corn syrup or crystalline fructose;   Apple juice   Other fruit juices   Apple cider   Instant coffee   Chamomile tea   Fennel tea    Sweeteners  Granulated sugar   Evaporated cane juice   Brown sugar   Brown rice syrup   Pure maple syrup   Corn syrup   Sugar cane molasses Aspartame   Saccharin   Sucralose   Stevia  Cocoa High-fructose corn syrup   Crystalline fructose   Honey   Agave   Sugar beet molasses   Sorbitol   Xylitol   Mannitol   Maltitol    Low FODMAP Meal, Snack and Protein Ideas  Breakfast  ? Oatmeal (? cup, cooked) topped with strawberries and blueberries (about ? cup total) and 1 tablespoon chopped walnuts.  ? Egg omelet filled with baby spinach, red pepper and cheddar cheese. Enjoy with an orange.  ? Udi?s white bread toasted with 2 tablespoons peanut butter (all natural) topped with ? sliced banana and a sprinkle of chia seeds.  ? [place] Corn Flakes or Crispy Avon Products (gluten free) cereal with lactose free milk and ? sliced banana and 1 tablespoon pumpkin seeds.  ? EnvironKidz [employer] with lactose free milk topped with ? cup blueberries.  ? Banana Walnut Pancakes:  [place] Waffle and Pancake Mix or Bisquick Gluten free Pancake and Baking Mix prepared with FODMAP friendly ingredients adding in ? mashed ripe banana, 1 tablespoon chopped walnuts and cook as directed.  Top with a drizzle of pure maple syrup.  ? [employer] (check ingredients avoid those with honey or use other suitable lactose free yogurt) top with ? cup Bear Naked Vanilla Almond Granola and ?-1 cup strawberries.  ? [place] Simply Granola Vanilla and Flax with lactose free milk and an orange.  ? EnvironKidz Panda Puffs with lactose free milk and a kiwifruit.  ? Smoothie: ? cup frozen blueberries, 3-4 ounces plain [place]* (or substitute in lactose free) yogurt blended with 2 teaspoons chia seeds until frothy (lactose content should be tolerated in 3-4 oz.)    ? Vanilla  [place] Toast:  Whisk 1-2 eggs with ? cup lactose free milk, 1 teaspoon vanilla extract and a dash of cinnamon. Using suitable gluten free bread, dip in mixture and brown up in skillet. Drizzle with pure maple syrup or a sprinkle of confectioner?s sugar.  Top with ? cup fresh sliced strawberries and 1 tablespoon of sliced almonds.     Lunch and Dinner  ? ?Rice Bowl?: scoop of brown rice, layered next with chopped Boston lettuce, cherry tomatoes, and top with grilled chicken or shrimp, and grated cheddar.  Add fresh lemon juice and olive oil drizzle for dressing.  ? Tuna Salad Lettuce Wraps: Tuna mixed with Hellman?s mayonnaise, splash of fresh lemon juice, ? celery stalk sliced and fresh dill, served in [employer] leaves and a side of Baked Kettle potato chips. Enjoy with 3/4 cup chopped cantaloupe.  ? Grilled cheddar, ham and tomato sandwich (use Udi?s White bread or other low FODMAP bread choice) with a side of kale salad (1 cup finely chopped kale, 5 cherry tomatoes, 1 tablespoon pumpkin seeds with olive oil and lemon dressing)   ? Tostada Pizza-Cook 1 pound ground chicken with 1 tablespoon of chili powder (choose chili powder without added onion such as Spice Appeal Brand), 1 teaspoon cumin, 1 teaspoon paprika and ? teaspoon salt in non-stick skillet.  Add ? cup of water and simmer until cooked through and no longer pink and cooked through.  Top tostada shell with ? cup of meat mixture and sprinkle of grated cheddar cheese.  Bake in 350 degree F oven until cheese melts (about 3-4 minutes).  ? Stuffed Baked Potato:  Carefully scoop out hot potato filling from one large baked potato. Mix with 1 tablespoon lactose free milk and 2 teaspoons butter. Sprinkle with 1/4 cup shredded cheddar cheese, mash to blend and place back in hot potato. Top with saut?ed red peppers.  ? Lean piece of grilled steak (London broil or Flank), Bibb lettuce salad with grated carrots, cherry tomatoes and orange pepper slices with red wine vinegar and olive oil dressing and side of roasted potatoes.  ? Rice pasta tossed with fresh chopped tomatoes, garlic infused oil (don?t eat the garlic!), and fresh basil. Serve with lean, center cut pork chop and saut?ed zucchini.  ? Corn pasta tossed with garlic infused olive oil, ? cup feta cheese, ? cup drained and rinsed canned chickpeas, a few kalamata olives, chopped fresh parsley and mint or basil per preference.  ? Stuffed peppers:  Manson Passey and cook thoroughly ground [place], beef or Quorn grounds (no onion or garlic).  Season with sea salt & pepper. Toss with equal amounts of cooked rice or quinoa.  Blend in small amounts of feta cheese, and parsley.  Stuff peppers and bake in covered casserole dish until pepper is soft and cheese is melted.   ? Homemade chicken and rice soup with rice crackers and ? cup mandarin oranges  ? Stir fry: Firm tofu saut?ed with Boeing, carrots, red bell pepper. Serve over white or brown rice with gluten free soy sauce  ? Grilled chicken with a medium baked potato and butter with a side of roasted carrots  ? Baked pork chop with ? cup sweet potato and a tossed salad with tomato and cucumber  ? Grilled scallops with a side of quinoa saut?ed with celery, carrots, peas and corn  ? [place] with gravy from the bird thickened with corn starch, mashed potatoes (no milk/cream added) and 1/3 cup of corn  ? Grilled  kabobs with chicken, green bell pepper, tomato, zucchini. Side of brown rice or quinoa  ? Grilled salmon or white fish with lemon. Baked potato or brown rice plus ? cup steamed allowable vegetable  ? Sushi- [place] roll, salmon avocado, spring roll, etc. and gluten free soy sauce  Snacks  ? Snyder?s of [place] pretzels (gluten free) and cheddar cheese   ? Rice cake with peanut butter, topped with ? ripe banana sliced   ? Rice crackers, Swiss cheese slice and 10 grapes   ? Vanilla lactose free yogurt [facility]) with blueberries and 1 tablespoon chia seeds   ? ? sandwich with Udi?s bread with sliced chicken, Bibb lettuce and tomato slice.  ? Lundberg Rice Chips (sea salt), handful of peanuts and a few baby carrots   ? Crunchmaster Multi-seed crackers, string cheese and an orange   ? Baby carrots and sliced cucumber wedges with dilly dip (Blend ? cup lactose free cottage cheese with fresh chopped dill, sea salt, pepper in blender until creamy)   ? Banana slices with spoonful of almond butter or peanut butter and sprinkle of semi-sweet chocolate chips   ? Go Macro protein replenishment peanut butter bar or [place] chocolate and sea salt bar.    Protein powders/Drinks  ? Nutribiotic Organic Rice Protein Powder: vanilla, plain, chocolate, mixed berry   ? Dr. Rhetta Mura Zone Protein Powder   ? [place]arrow Brown Rice Protein Concentrate: vanilla, berry, chocolate (contain several gums. Gums are not FODMAPs but are fermentable and can sometimes be poorly tolerated)   ? Solgar Whey to Go Whey Protein Powder (98% lactose free): vanilla  ? Biochem 100% Whey Protein Powder: vanilla, chocolate, natural (contains xanthan gum)   ? Optimum Nutrition 100% Whey Gold Standard: Vanilla Ice Cream (appears to contain 1 gram of lactose with lactase enzyme added to help digest it)  ? Unjury: unflavored  ? Sharyne Peach Egg White Protein: unflavored  ? Perfect Fit: vanilla, chocolate (contains gum)  ? ProNourish protein drink by Karn Pickler        Remember to always check nutrition labels on packaged and processed foods for high FODMAP ingredients!          Low FODMAP Resources  Websites and Blogs  www.myginutrition.com   - Visual animations of the Low FODMAP diet by the [facility] of [place] with visual and plenty of recipes    www.katescarlata.com   - Hassan Buckler, RD has great materials on the diet as well as low FODMAP grocery lists, foods lists, and information on menu planning      www.ibsfree.net  - Patsy Catsos is the pioneer of the Low FODMAP diet in the [place]. Also see her book, IBS Elimination Diet and Cookbook available on [employer]    SolarInventors.es - The [place] research group that studies FODMAPs      HDTVBulletin.se - Best boy of the FODMAP acronym     Books & Cookbooks   IBS Elimination Diet and Cookbook by Lanelle Bal, MS, RD  The Low-FODMAP Diet Step by Step by Hassan Buckler and Bennie Dallas  The Two-Step Low-Fodmap Diet and Recipe Book by Dr. Perlie Gold    Pinterest Boards   www.GamingWild.de    www.https://www.vargas.com/   www.PicVisions.is    www.http://www.dixon-collier.info/    www.https://www.wilson.com/      Apps    [employer] Low [place] app - It is updated regularly as new foods are tested. All the money from app sales go directly back to testing of foods.  FODMAP Friendly - An international program that certifies FODMAP levels in packaged food          Adapted from [employer]  http://www.strategicnutritioncounseling.com/   Adapted from Strategic Nutrition http://www.strategicnutritioncounseling.com/     3. Follow up with Dr. Danella Sensing nurse practitioner Mikeal Hawthorne APRN in 4 months.   _________________________________________________    Please call Dr. Danella Sensing and Dr. Lahoma Rocker nurse Darl Pikes at [phone] if you have any questions or concerns.    General Instructions:  ? To have a medication refilled:  Please use the MyChart Refill request or contact your pharmacy directly to request medication refills.  Please allow 72 hours.   ? Radiology is on the 2nd floor of the Medical Office Building and the 2nd Floor of [facility]. Services also available at [facility] locationRadiology Scheduling can be reached at [phone]  ? To Schedule an office visits:  Call [phone] Option 1  ? For procedure scheduling questions at the [facility] or [place] please call [phone]; for a procedure at [facility] please call [phone].  ? To receive appointment reminders on your cell phone: Make sure we have your cell phone number, and Text [place] to [phone].  ? Support for many chronic illnesses is available through [place]: SeekAlumni.no or [phone].  ? For urgent questions on nights, weekends or holidays, call the Operator at [phone], and ask for the doctor on call for Gastroenterology. Call 911 for any emergencies.  ? Lab locations; Outpatient Lab Information  ? [facility])         [address]         [place], [place] [address]         8 a.m.-5 p.m., M-F    ? [facility])          [address]         [place], [place] [address]         [phone]         7 a.m.-5:30 p.m. Monday-Friday         7:00 a.m.-12:00 p.m. Sat-Sun    ? [facility]         [address] [place], [place] [address]         Outpatient Lab located on 2nd floor         8 a.m.-5 p.m., M-F    ? [facility]  [address]  [place], [place]  At [place] and [address],  adjacent to [employer] of [facility].        Can park in the [employer] (P2)        Outpatient Lab located on 1st floor         6:30 a.m.-6:00 p.m., Mon.        7:00 a.m. ? 6:00 p.m., Tues.-Fri.        7:00 a.m. ? 12:00 p.m., Sat    ? [facility] ? Appt Only (call [phone])  [address] [place], [place] [address]  8 a.m.-5 p.m., M-F    ? [facility] ?Appt Only (call [phone])  [address]  [place], [place] [address]  8 a.m.-5 p.m., M-F     As part of the CARES act, starting April 1st some results are released to you automatically. Your provider will continue to send  you a detailed result note on any labs that they order, but with these changes you may [place] your results before they do. Critical lab results will be addressed immediately, but otherwise please give your provider 72 hours (3 business days) to view and respond to your results before reaching out with any questions. Depending on your questions, they may ask you to schedule a telehealth or telephone visit to discuss further. This visit may be billed to your insurance depending on time and complexity

## 2020-06-21 ENCOUNTER — Encounter: Admit: 2020-06-21 | Discharge: 2020-06-21 | Payer: MEDICAID

## 2020-06-21 NOTE — Telephone Encounter
Pt called wanting to be seen due to incontinent of urine. Explained to pt that was out of our scope of GI. Pt requesting we place referral for urology to someone near her home. Requested pt to call her PCP, she states they fired her because of not keeping appointments. Pt will find a urologist she wants to see & will let us know.

## 2020-06-23 ENCOUNTER — Encounter: Admit: 2020-06-23 | Discharge: 2020-06-23 | Payer: MEDICAID

## 2020-06-25 ENCOUNTER — Encounter: Admit: 2020-06-25 | Discharge: 2020-06-25 | Payer: MEDICAID

## 2020-06-25 NOTE — Telephone Encounter
Pt called left IVM that if we wanted lab work we had to tell her. Called pt back to let her know she already did the lab we ordered. Pt had appt 4/27 cancelled since she was just seen on 06/09/20.

## 2020-06-28 ENCOUNTER — Encounter: Admit: 2020-06-28 | Discharge: 2020-06-28 | Payer: MEDICAID

## 2020-06-28 NOTE — Telephone Encounter
Patient left VMX states her other doctor advised she needs to find a specialist.     Rtd call to patient reports leaking urine that is happening more frequently. Advised patient she has a urology appt on 6/2 will try to see if we are able to get her in sooner, patient v/u. Patient also has appt with Dr. Ernesta Amble on 5/3 for "ovarian cysts" advised will check with Dr. Ernesta Amble about obtaining an [place] prior to her appointment and call her back on tomorrow. Patient v/u, no further questions.

## 2020-06-29 ENCOUNTER — Encounter: Admit: 2020-06-29 | Discharge: 2020-06-29 | Payer: MEDICAID

## 2020-06-29 DIAGNOSIS — N83209 Unspecified ovarian cyst, unspecified side: Secondary | ICD-10-CM

## 2020-06-29 NOTE — Telephone Encounter
Attempted to call patient, no answer. Left vmx per permission to communicate form asking patient if she was able to arrange transportation for tomorrow to have US done at 1:00. Advised patient if unable to find transportation we will assist her in getting [place] scheduled for a different date/time. Advised patient to call back and let us know.

## 2020-06-29 NOTE — Telephone Encounter
Pt called and left vxm stating she tried to schedule her ultrasound but there was no order.     RN ordered ultrasound again.    Rtd call, adv pt that I re-ordered u/s, pt v/u.

## 2020-06-30 ENCOUNTER — Ambulatory Visit: Admit: 2020-06-30 | Discharge: 2020-06-30 | Payer: MEDICAID

## 2020-06-30 ENCOUNTER — Encounter: Admit: 2020-06-30 | Discharge: 2020-06-30 | Payer: MEDICAID

## 2020-06-30 DIAGNOSIS — N83209 Unspecified ovarian cyst, unspecified side: Secondary | ICD-10-CM

## 2020-07-06 ENCOUNTER — Ambulatory Visit: Admit: 2020-07-06 | Discharge: 2020-07-07 | Payer: MEDICAID

## 2020-07-06 ENCOUNTER — Encounter: Admit: 2020-07-06 | Discharge: 2020-07-06 | Payer: MEDICAID

## 2020-07-06 DIAGNOSIS — IMO0001 Medical history reviewed with no changes: Secondary | ICD-10-CM

## 2020-07-06 DIAGNOSIS — J449 Chronic obstructive pulmonary disease, unspecified: Secondary | ICD-10-CM

## 2020-07-06 DIAGNOSIS — K929 Disease of digestive system, unspecified: Secondary | ICD-10-CM

## 2020-07-06 DIAGNOSIS — A749 Chlamydial infection, unspecified: Secondary | ICD-10-CM

## 2020-07-06 DIAGNOSIS — A549 Gonococcal infection, unspecified: Secondary | ICD-10-CM

## 2020-07-06 DIAGNOSIS — B977 Papillomavirus as the cause of diseases classified elsewhere: Secondary | ICD-10-CM

## 2020-07-06 DIAGNOSIS — F99 Mental disorder, not otherwise specified: Secondary | ICD-10-CM

## 2020-07-06 DIAGNOSIS — N3010 Interstitial cystitis (chronic) without hematuria: Secondary | ICD-10-CM

## 2020-07-06 DIAGNOSIS — N83209 Unspecified ovarian cyst, unspecified side: Secondary | ICD-10-CM

## 2020-07-06 DIAGNOSIS — I219 Acute myocardial infarction, unspecified: Secondary | ICD-10-CM

## 2020-07-06 DIAGNOSIS — C801 Malignant (primary) neoplasm, unspecified: Secondary | ICD-10-CM

## 2020-07-06 NOTE — Progress Notes
Date of Service: 07/06/2020    Subjective:             Adriana Tran is a 40 y.o. female.    History of Present Illness  40yo G15 [id number] here for ovarian cysts and pelvic pain.   Adriana Tran reports having pelvic and abdominal pain for 2 years.   Has seen GI and has seen by multiple Gyn providers at [facility].   States pain is consistent with pain that has had in the past, and is concerned that is due to the ovarian cysts that were diagnosed a long time ago.     TVUS dne on 06/30/20:   Uterus 7.6 x 3.7 x 4.7cm, no myometrial masses, endometrium 0.2cm.   Right ovary: 4.9 x 3.0 x 5.7cm, occupied by ovarian cystic lesion containes a curved thin spetation with no internal blood flow.   Left ovary: 6.0 x 4.4 x 4.6cm, simple ovarian cystic lesion without internal blood flow measuring 4.8 x 3.9 x 4.3cm, previously 4.3 x 3.5 x 2.5cm (02/2017)    Prior TVUS on 09/17/19: right ovary with cyst of 4.5 x 3.7 x 5.1cm  Similar cystic lesion seen on CT of abd/pelvix on 07/15/13.     History obtained by patient and needed to be re-directed to current problem multiple times.       Medical History:   Diagnosis Date   ? Cancer (HCC)     cervical ca   ? Chlamydia    ? COPD (chronic obstructive pulmonary disease) (HCC)    ? Gastrointestinal disorder     gastroparesis   ? Gonorrhea    ? HPV (human papilloma virus) infection    ? IC (interstitial cystitis)    ? Medical history reviewed with no changes    ? Myocardial infarction [facility])     2014   ? Psychiatric illness     Bipolar, schizophrenia, panic attacks     Surgical History:   Procedure Laterality Date   ? HX CHOLECYSTECTOMY  2005   ? COLONOSCOPY N/A 07/29/2014    Performed by Raynelle Chary, MD at [facility] ENDO   ? ESOPHAGEAL PH CAPSULE PLACEMENT N/A 07/29/2014    Performed by Raynelle Chary, MD at [facility] ENDO   ? HYDROGEN BREATH TEST N/A 08/31/2015    Performed by Michelene Heady, MBBS at [facility] ENDO   ? BREATH HYDROGEN/ METHANE TESTING N/A 08/20/2018    Performed by Eliott Nine, MD at [facility] ENDO Family History   Problem Relation Age of Onset   ? Other Maternal Grandfather         kidney failure   ? None Reported Mother    ? None Reported Father      Social History     Socioeconomic History   ? Marital status: Single     Spouse name: Not on file   ? Number of children: Not on file   ? Years of education: Not on file   ? Highest education level: Not on file   Occupational History   ? Not on file   Tobacco Use   ? Smoking status: Former Smoker     Packs/day: 2.00     Types: Cigarettes     Quit date: 06/25/2017     Years since quitting: 3.0   ? Smokeless tobacco: Never Used   ? Tobacco comment: approx quit date   Substance and Sexual Activity   ? Alcohol use: No     Alcohol/week: 0.0 standard drinks  Comment: occ   ? Drug use: No   ? Sexual activity: Not Currently     Partners: Male   Other Topics Concern   ? Not on file   Social History Narrative   ? Not on file                      Review of Systems   Constitutional: Positive for fatigue and unexpected weight change.   HENT: Positive for voice change.    Eyes: Negative.    Respiratory: Positive for cough.    Cardiovascular: Positive for chest pain.   Gastrointestinal: Positive for abdominal pain and diarrhea.   Endocrine: Negative.    Genitourinary: Positive for enuresis, frequency, genital sores, pelvic pain, urgency, vaginal bleeding, vaginal discharge and vaginal pain.   Musculoskeletal: Negative.    Skin: Negative.    Allergic/Immunologic: Positive for environmental allergies and food allergies.   Neurological: Positive for headaches.   Hematological: Negative.    Psychiatric/Behavioral: Positive for confusion. The patient is nervous/anxious.          Objective:         ? benztropine (COGENTIN) 1 mg tablet Take 1 mg by mouth twice daily.   ? clonazePAM (KLONOPIN) 1 mg tablet    ? dicyclomine (BENTYL) 10 mg capsule Take 10 mg by mouth four times daily.   ? gabapentin (NEURONTIN) 600 mg tablet Take 600 mg by mouth three times daily.   ? lithium carbonate (ESKALITH) 150 mg capsule Take 300 mg by mouth twice daily with meals. Take with food.    ? loratadine (CLARITIN) 10 mg tablet Take 10 mg by mouth every morning.   ? omeprazole DR (PRILOSEC) 20 mg capsule Take 20 mg by mouth daily before breakfast.   ? OXcarbazepine (TRILEPTAL) 600 mg tablet Take 600 mg by mouth.   ? pentosan polysulfate sodium (ELMIRON) 100 mg capsule Take 100 mg by mouth three times daily before meals.   ? terconazole (TERAZOL 3) 80 mg vaginal suppository Insert or Apply one suppository to vaginal area at bedtime daily.   ? traZODone (DESYREL) 100 mg tablet Take 100 mg by mouth at bedtime daily.   ? traZODone (DESYREL) 50 mg tablet Take  by mouth at bedtime daily.   ? ziprasidone hcl (GEODON) 60 mg capsule TAKE ONE CAPSULE BY MOUTH TWICE DAILY WITH A MEAL     Vitals:    07/06/20 0929   BP: 107/73   BP Source: Arm, Left Upper   Pulse: 110   Temp: 36.1 ?C (97 ?F)   Resp: 16   SpO2: 100%   TempSrc: Temporal   PainSc: Five   Weight: 90.7 kg (200 lb)   Height: 174 cm (5' 8.5)     Body mass index is 29.97 kg/m?Marland Kitchen     Physical Exam  Gen: alert/NAD  Resp: nonlabored  Abdomen: soft, no masses       Assessment and Plan:  Adriana Tran was seen today for follow up.    Diagnoses and all orders for this visit:    Cyst of ovary, unspecified laterality    Pelvic pain  -     AMB REFERRAL TO GYNECOLOGY                 I discussed with Adriana Tran that pelvic pain can be multifactorial, she has seen GI and is seeing urology for her interstitial cystitis. I discussed the option of hormonal contraception and Adriana Tran strongly voiced her desire  to avoid all contraception options.     We discussed that surgical management of ovarian cystectomy, however we discussed that this will only treat in removing the cysts that are present, will NOT prevent formation of new ovarian cysts and will not guarantee resolution of pelvic pain.     We discussed that pelvic pain is multifactorial and discussed the option of referral to our  pelvic pain clinic. If ovarian cystectomies are not performed will plan on repeat ultrasound 6-12 months.     At this time Adriana Tran elects for referral to Pelvic Pain Clinic, we discussed that if surgical management of ovarian cysts is desired that I or Pelvic Pain physician can perform.     F/u with Pelvic Pain Clinic

## 2020-07-06 NOTE — Patient Instructions
General Instructions  For prompt and efficient communication, MyChart is preferred. Please sign up.  [email or web address]      To Contact Me:   Please send a MyChart message to your doctor or call [phone] to reach your doctor's nurse team. Please only call once in a 24-hour period. Leave a voicemail for the nurse to respond.  Calls or messages received after 4pm will be returned the next business day.  To Have Medication Refilled:   Please use the MyChart Refill request or contact your pharmacy directly to request medication refills. Please allow 72 hours.  To Receive Your Test Results:  ? Please allow 2 business day for labs to result in MyChart.  ? Please allow 4 business days for other tests, including cardiac studies, blood bank, and radiology results.  ? It can take up to 10 days for pathology  Our Lab (Location, Hours, and Fasting Information)  Lab Location: the 1st floor of the Medical Office Building, directly to the left of the Information Desk.  Lab Hours: Monday 6:30 am-6 pm, Tuesday-Friday 7 am-6 pm, and Saturday 7 am-noon.   Fasting for Lab: For fasting labs, please:   Do not eat for at least 8-10 hours before having your labs drawn, drink plenty of water, and make sure to continue your medications as prescribed (unless otherwise specified).  Radiology is on the 2nd floor of the Medical Office Building. Please call [phone], option #1; Mammogram at [place] location, please call [phone], option #1.  To Schedule an Appointment:   Contact our schedulers at [phone] and select #1 to make an appointment. At this time, you will be encouraged to signed up to MyChart if you not active.     To Receive Appointment Reminders on Your Cell Phone:   Make sure we have your cell phone number, and text [place] to [phone].  For Urgent Questions:   For medical emergencies, call 911.  On nights, weekends or holidays, call the Hospital operator at [phone], and ask for the ?Ob/Gyn Physician on call or Certified Nurse Midwife on call.?        We value your feedback and you may receive a survey about your experience with our office. If you had a favorable experience, the only positive survey results that we will receive are if we are rated in the 9 or 10 range out of 10 points. Please let us know why we did not earn 9-10 rating.  Thank you.

## 2020-07-07 DIAGNOSIS — R102 Pelvic and perineal pain: Secondary | ICD-10-CM

## 2020-07-19 ENCOUNTER — Encounter: Admit: 2020-07-19 | Discharge: 2020-07-19 | Payer: MEDICAID

## 2020-07-19 NOTE — Telephone Encounter
Pt left vmx requesting information about her upcoming appt in the pelvic pain clinic , requested Drs. Name and number .     Called pt and advised of Dr. Johnn Hai full name and nurse line number to contact, also provided scheduling line and nurse line team 2 for Dr. Ernesta Amble. Pt v/u

## 2020-07-23 ENCOUNTER — Encounter: Admit: 2020-07-23 | Discharge: 2020-07-23 | Payer: MEDICAID

## 2020-07-29 ENCOUNTER — Encounter: Admit: 2020-07-29 | Discharge: 2020-07-29 | Payer: MEDICAID

## 2020-08-05 ENCOUNTER — Encounter: Admit: 2020-08-05 | Discharge: 2020-08-05 | Payer: MEDICAID

## 2020-08-27 ENCOUNTER — Encounter: Admit: 2020-08-27 | Discharge: 2020-08-27 | Payer: MEDICAID

## 2020-09-10 ENCOUNTER — Encounter: Admit: 2020-09-10 | Discharge: 2020-09-10 | Payer: MEDICAID

## 2020-09-10 DIAGNOSIS — Z3200 Encounter for pregnancy test, result unknown: Secondary | ICD-10-CM

## 2020-09-10 NOTE — Telephone Encounter
Pt called and left vxm stating she has not had a period since April, had sexual intercourse with a condom in April. Pt has not taken a pregnancy test at home, was told on the phone to call us to triage her.    Pt called scheduling, transferred to nurse line and RN answered. Adv pt to take HPT, pt states she cannot afford to buy one. Gave pt the option to go to lab for beta or clinic for UPT, pt opted for beta. Earliest pt can come with transportation is Wed 7/13, adv pt that is okay and that we will notify her when we receive the results, pt v/u.

## 2020-09-15 ENCOUNTER — Encounter: Admit: 2020-09-15 | Discharge: 2020-09-15 | Payer: MEDICAID

## 2020-09-15 NOTE — Telephone Encounter
Patient left VMX asking about lab locations other then main campus.    Rtd call to patient provided lab information for [facility] office. PAtient v/u and states this is where she set up to have labs done. Going on Friday.

## 2020-09-22 ENCOUNTER — Encounter: Admit: 2020-09-22 | Discharge: 2020-09-22 | Payer: MEDICAID

## 2020-09-22 NOTE — Telephone Encounter
Patient left vmx stating that she thinks she may be pregnant and is wanting to have blood work done to see if she is pregnant.    Spoke with patient. Advised patient Beta order is in place and that she needs to go to the lab to have this drawn. Patient v/u.

## 2020-09-23 ENCOUNTER — Encounter: Admit: 2020-09-23 | Discharge: 2020-09-23 | Payer: MEDICAID

## 2020-09-23 NOTE — Telephone Encounter
Patient left VMX asking for address of lab.    Rtd call to patient confirmed correct address, zip code AND lab hours at [place] office. Patient v/u wrote everything down. All questions answered.

## 2020-09-28 ENCOUNTER — Ambulatory Visit: Admit: 2020-09-28 | Discharge: 2020-09-28 | Payer: MEDICAID

## 2020-09-28 ENCOUNTER — Encounter: Admit: 2020-09-28 | Discharge: 2020-09-28 | Payer: MEDICAID

## 2020-09-28 DIAGNOSIS — Z3200 Encounter for pregnancy test, result unknown: Principal | ICD-10-CM

## 2020-09-28 LAB — BETA-HCG: BETA-HCG: <1 U/L (ref <5)

## 2020-09-29 ENCOUNTER — Encounter: Admit: 2020-09-29 | Discharge: 2020-09-29 | Payer: MEDICAID

## 2020-09-29 NOTE — Telephone Encounter
Pt called and left vxm stating she would like to know the results of her lab test from yesterday, was told she would know results this morning and keeps getting answering machine, requesting call back.    Rtd call, adv pt of negative beta results and that she is not pregnant, pt v/u.

## 2020-10-04 ENCOUNTER — Encounter: Admit: 2020-10-04 | Discharge: 2020-10-04 | Payer: MEDICAID

## 2020-10-04 ENCOUNTER — Ambulatory Visit: Admit: 2020-10-04 | Discharge: 2020-10-04 | Payer: MEDICAID

## 2020-10-04 ENCOUNTER — Ambulatory Visit: Admit: 2020-10-04 | Discharge: 2020-10-05 | Payer: MEDICAID

## 2020-10-04 DIAGNOSIS — J449 Chronic obstructive pulmonary disease, unspecified: Secondary | ICD-10-CM

## 2020-10-04 DIAGNOSIS — F2089 Other schizophrenia: Secondary | ICD-10-CM

## 2020-10-04 DIAGNOSIS — N912 Amenorrhea, unspecified: Secondary | ICD-10-CM

## 2020-10-04 DIAGNOSIS — F99 Mental disorder, not otherwise specified: Secondary | ICD-10-CM

## 2020-10-04 DIAGNOSIS — C801 Malignant (primary) neoplasm, unspecified: Secondary | ICD-10-CM

## 2020-10-04 DIAGNOSIS — A549 Gonococcal infection, unspecified: Secondary | ICD-10-CM

## 2020-10-04 DIAGNOSIS — IMO0001 Medical history reviewed with no changes: Secondary | ICD-10-CM

## 2020-10-04 DIAGNOSIS — A749 Chlamydial infection, unspecified: Secondary | ICD-10-CM

## 2020-10-04 DIAGNOSIS — F410 Panic disorder [episodic paroxysmal anxiety] without agoraphobia: Secondary | ICD-10-CM

## 2020-10-04 DIAGNOSIS — F319 Bipolar disorder, unspecified: Secondary | ICD-10-CM

## 2020-10-04 DIAGNOSIS — N3010 Interstitial cystitis (chronic) without hematuria: Secondary | ICD-10-CM

## 2020-10-04 DIAGNOSIS — I219 Acute myocardial infarction, unspecified: Secondary | ICD-10-CM

## 2020-10-04 DIAGNOSIS — B977 Papillomavirus as the cause of diseases classified elsewhere: Secondary | ICD-10-CM

## 2020-10-04 DIAGNOSIS — K929 Disease of digestive system, unspecified: Secondary | ICD-10-CM

## 2020-10-04 LAB — ESTRADIOL (E2): ESTRADIOL: <15 pg/mL (ref 4–12)

## 2020-10-04 LAB — PROLACTIN: PROLACTIN: 94 ng/mL — ABNORMAL HIGH (ref 3.3–26.7)

## 2020-10-04 LAB — FOLLICLE STIMULATING HORMONE: FSH: 8.5 mU/mL (ref 0–2)

## 2020-10-04 LAB — CORTISOL-AM: CORTISOL-AM: 14 ug/dL (ref 6.7–22.6)

## 2020-10-04 LAB — TSH WITH FREE T4 REFLEX: TSH: 4.1 uU/mL (ref 0.35–5.00)

## 2020-10-08 ENCOUNTER — Encounter: Admit: 2020-10-08 | Discharge: 2020-10-08 | Payer: MEDICAID

## 2020-10-08 DIAGNOSIS — R7989 Other specified abnormal findings of blood chemistry: Secondary | ICD-10-CM

## 2020-10-11 ENCOUNTER — Ambulatory Visit: Admit: 2020-10-11 | Discharge: 2020-10-11 | Payer: MEDICAID

## 2020-10-11 ENCOUNTER — Encounter: Admit: 2020-10-11 | Discharge: 2020-10-11 | Payer: MEDICAID

## 2020-10-11 DIAGNOSIS — B977 Papillomavirus as the cause of diseases classified elsewhere: Secondary | ICD-10-CM

## 2020-10-11 DIAGNOSIS — I219 Acute myocardial infarction, unspecified: Secondary | ICD-10-CM

## 2020-10-11 DIAGNOSIS — N3010 Interstitial cystitis (chronic) without hematuria: Secondary | ICD-10-CM

## 2020-10-11 DIAGNOSIS — H93293 Other abnormal auditory perceptions, bilateral: Secondary | ICD-10-CM

## 2020-10-11 DIAGNOSIS — M26622 Arthralgia of left temporomandibular joint: Secondary | ICD-10-CM

## 2020-10-11 DIAGNOSIS — F99 Mental disorder, not otherwise specified: Secondary | ICD-10-CM

## 2020-10-11 DIAGNOSIS — A749 Chlamydial infection, unspecified: Secondary | ICD-10-CM

## 2020-10-11 DIAGNOSIS — IMO0001 Medical history reviewed with no changes: Secondary | ICD-10-CM

## 2020-10-11 DIAGNOSIS — K029 Dental caries, unspecified: Secondary | ICD-10-CM

## 2020-10-11 DIAGNOSIS — K929 Disease of digestive system, unspecified: Secondary | ICD-10-CM

## 2020-10-11 DIAGNOSIS — H938X3 Other specified disorders of ear, bilateral: Secondary | ICD-10-CM

## 2020-10-11 DIAGNOSIS — A549 Gonococcal infection, unspecified: Secondary | ICD-10-CM

## 2020-10-11 DIAGNOSIS — C801 Malignant (primary) neoplasm, unspecified: Secondary | ICD-10-CM

## 2020-10-11 DIAGNOSIS — J449 Chronic obstructive pulmonary disease, unspecified: Secondary | ICD-10-CM

## 2020-10-11 NOTE — Progress Notes
Date of Service: 10/11/2020    Subjective:             Adriana Tran is a 40 y.o. female.    History of Present Illness    [name] was self referred as a new patient today for left more than right ear pain and bilateral ear fullness.  She has been told her hearing and ears are normal in the past (as recently as yesterday by an outside provider).  No otorrhea.  No vertigo.  Chronic throat burning but no neck mass or malignant symptoms.  She has a dental appointment pending.  Ear symptoms present for 7 years but dental issues less long than that.    Medical History:   Diagnosis Date   ? Cancer (HCC)     cervical ca   ? Chlamydia    ? COPD (chronic obstructive pulmonary disease) (HCC)    ? Gastrointestinal disorder     gastroparesis   ? Gonorrhea    ? HPV (human papilloma virus) infection    ? IC (interstitial cystitis)    ? Medical history reviewed with no changes    ? Myocardial infarction [facility])     2014   ? Psychiatric illness     Bipolar, schizophrenia, panic attacks     Surgical History:   Procedure Laterality Date   ? HX CHOLECYSTECTOMY  2005   ? COLONOSCOPY N/A 07/29/2014    Performed by Raynelle Chary, MD at [facility] ENDO   ? ESOPHAGEAL PH CAPSULE PLACEMENT N/A 07/29/2014    Performed by Raynelle Chary, MD at [facility] ENDO   ? HYDROGEN BREATH TEST N/A 08/31/2015    Performed by Michelene Heady, MBBS at [facility] ENDO   ? BREATH HYDROGEN/ METHANE TESTING N/A 08/20/2018    Performed by Eliott Nine, MD at [facility] ENDO     Family History   Problem Relation Age of Onset   ? Other Maternal Grandfather         kidney failure   ? None Reported Mother    ? None Reported Father      Social History     Tobacco Use   Smoking Status Former Smoker   ? Packs/day: 2.00   ? Types: Cigarettes   ? Quit date: 06/25/2017   ? Years since quitting: 3.2   Smokeless Tobacco Never Used   Tobacco Comment    approx quit date     Social History     Substance and Sexual Activity   Drug Use No       PMH, SH, FH, allergies and medications above have been reviewed.       Review of Systems   Constitutional: Positive for activity change and appetite change.   HENT: Negative.    Eyes: Negative.    Respiratory: Negative.    Cardiovascular: Negative.    Gastrointestinal: Negative.    Endocrine: Negative.    Genitourinary: Negative.    Musculoskeletal: Negative.    Skin: Negative.    Allergic/Immunologic: Negative.    Neurological: Positive for headaches.   Hematological: Negative.    Psychiatric/Behavioral: Negative.          Objective:         ? benztropine (COGENTIN) 1 mg tablet Take 1 mg by mouth twice daily.   ? clonazePAM (KLONOPIN) 1 mg tablet    ? dicyclomine (BENTYL) 10 mg capsule Take 10 mg by mouth four times daily.   ? gabapentin (NEURONTIN) 600 mg tablet Take 600 mg by  mouth three times daily.   ? lithium carbonate (ESKALITH) 150 mg capsule Take 300 mg by mouth twice daily with meals. Take with food.    ? loratadine (CLARITIN) 10 mg tablet Take 10 mg by mouth every morning.   ? omeprazole DR (PRILOSEC) 20 mg capsule Take 20 mg by mouth daily before breakfast.   ? OXcarbazepine (TRILEPTAL) 600 mg tablet Take 600 mg by mouth.   ? pentosan polysulfate sodium (ELMIRON) 100 mg capsule Take 100 mg by mouth three times daily before meals.   ? terconazole (TERAZOL 3) 80 mg vaginal suppository Insert or Apply one suppository to vaginal area at bedtime daily.   ? traZODone (DESYREL) 100 mg tablet Take 100 mg by mouth at bedtime daily.   ? traZODone (DESYREL) 50 mg tablet Take  by mouth at bedtime daily.   ? ziprasidone hcl (GEODON) 60 mg capsule TAKE ONE CAPSULE BY MOUTH TWICE DAILY WITH A MEAL     Vitals:    10/11/20 0817   PainSc: Zero   Weight: 96.8 kg (213 lb 6.4 oz)   Height: 174 cm (5' 8.5)     Body mass index is 31.98 kg/m?Marland Kitchen     Physical Exam  General: Well-developed, well-nourished   Communication and Voice: Clear pitch and clarity   Hearing: Hearing adequate for verbal communication bilaterally   Inspection: Normocephalic and atraumatic without mass or lesion Palpation: Facial skeleton intact without bony stepoffs.    Facial Strength: Facial motility symmetric and full bilaterally   Pinna: External ear intact and fully developed   External canal: Canal is patent with intact skin   Tympanic Membrane: Normal bilaterally   External nose: No scar or anatomic deformity   Internal Nose: Septum intact.  MMM.  Turbinates 2+  Oral cavity, Lips, Teeth, and Gums: Mucosa moist.  Poor dentition, No lesions, masses or ulcers   Oropharynx: No erythema or exudate, no masses or ulcerations, no asymmetry   Larynx: Normal voice, no stridor or stertor.   Neck, Trachea, Lymphatics: Midline trachea without mass or lesion, no lymphadenopathy . Left TMJ with ttp, recreates symptoms  Thyroid: No mass or nodularity   Eyes: No nystagmus with equal extraocular motion bilaterally   Neuro/Psych/Balance: Patient oriented and appropriate in interaction; Appropriate mood and affect; Gait is intact with no imbalance; Cranial nerves I-XII are intact   Respiratory effort: Equal inspiration and expiration, no respiratory distress   Peripheral Vascular: Warm extremities with equal distal pulses    Audiogram reviewed and discussed with patient.         Assessment and Plan:  1. Sensation of fullness in both ears     2. Dental caries     3. Arthralgia of left temporomandibular joint       NSAIDS with food TID x 10 days   Soft diet x 10 days.   Warm packs as needed.  Athletic type tooth guard qhs to prevent bruxism     If above measures fail to provide adequate relief they will return for flexible laryngoscopy to rule out occult OP/BOT neoplasm.    If normal FFL, Referral: Lorrin Mais or DDS with TMJ expertise     Agree with dental work up/extractions.    F/u prn.

## 2020-10-13 ENCOUNTER — Encounter: Admit: 2020-10-13 | Discharge: 2020-10-13 | Payer: MEDICAID

## 2020-10-13 NOTE — Telephone Encounter
Spoke with patient.  She was wanting to know why she received a message stating that she owed a copay. She stated that she did not.  I informed her that I did not know anything about insurance. Transferred pt to billing.  She then called back again, informing me that no one is helping her.  She stated she called Medicaid as well.   Ended the call explaining to her that I had no knowledge and could not assist her any further.

## 2020-10-14 ENCOUNTER — Encounter: Admit: 2020-10-14 | Discharge: 2020-10-14 | Payer: MEDICAID

## 2020-10-14 ENCOUNTER — Ambulatory Visit: Admit: 2020-10-14 | Discharge: 2020-10-14 | Payer: MEDICAID

## 2020-10-14 DIAGNOSIS — N3010 Interstitial cystitis (chronic) without hematuria: Principal | ICD-10-CM

## 2020-10-14 DIAGNOSIS — R350 Frequency of micturition: Secondary | ICD-10-CM

## 2020-10-14 DIAGNOSIS — J449 Chronic obstructive pulmonary disease, unspecified: Secondary | ICD-10-CM

## 2020-10-14 DIAGNOSIS — A549 Gonococcal infection, unspecified: Secondary | ICD-10-CM

## 2020-10-14 DIAGNOSIS — Z87891 Personal history of nicotine dependence: Secondary | ICD-10-CM

## 2020-10-14 DIAGNOSIS — A749 Chlamydial infection, unspecified: Secondary | ICD-10-CM

## 2020-10-14 DIAGNOSIS — IMO0001 Medical history reviewed with no changes: Secondary | ICD-10-CM

## 2020-10-14 DIAGNOSIS — F99 Mental disorder, not otherwise specified: Secondary | ICD-10-CM

## 2020-10-14 DIAGNOSIS — R197 Diarrhea, unspecified: Secondary | ICD-10-CM

## 2020-10-14 DIAGNOSIS — C801 Malignant (primary) neoplasm, unspecified: Secondary | ICD-10-CM

## 2020-10-14 DIAGNOSIS — R3915 Urgency of urination: Secondary | ICD-10-CM

## 2020-10-14 DIAGNOSIS — K929 Disease of digestive system, unspecified: Secondary | ICD-10-CM

## 2020-10-14 DIAGNOSIS — R140 Abdominal distension (gaseous): Secondary | ICD-10-CM

## 2020-10-14 DIAGNOSIS — I219 Acute myocardial infarction, unspecified: Secondary | ICD-10-CM

## 2020-10-14 DIAGNOSIS — R102 Pelvic and perineal pain: Secondary | ICD-10-CM

## 2020-10-14 DIAGNOSIS — B977 Papillomavirus as the cause of diseases classified elsewhere: Secondary | ICD-10-CM

## 2020-10-14 DIAGNOSIS — F319 Bipolar disorder, unspecified: Secondary | ICD-10-CM

## 2020-10-14 DIAGNOSIS — F2089 Other schizophrenia: Secondary | ICD-10-CM

## 2020-10-14 DIAGNOSIS — R070 Pain in throat: Secondary | ICD-10-CM

## 2020-10-14 LAB — URINALYSIS DIPSTICK
LEUKOCYTES: NEGATIVE
NITRITE: NEGATIVE
URINE ASCORBIC ACID, UA: NEGATIVE
URINE BILE: NEGATIVE
URINE BLOOD: NEGATIVE
URINE KETONE: NEGATIVE

## 2020-10-14 LAB — URINALYSIS, MICROSCOPIC

## 2020-10-14 MED ORDER — HYOSCYAMINE SULFATE 0.125 MG SL SUBL
125 ug | ORAL_TABLET | SUBLINGUAL | 3 refills | Status: AC | PRN
Start: 2020-10-14 — End: ?

## 2020-10-14 MED ORDER — ACETAMINOPHEN/LIDOCAINE/ANTACID DS(#) 1:1:3  PO SUSP
Freq: Four times a day (QID) | 1 refills | Status: AC | PRN
Start: 2020-10-14 — End: ?

## 2020-10-14 NOTE — Patient Instructions
It was a pleasure seeing you today, sometimes we may not have the final results, final recommendations, or dates, appointments and referrals scheduled when you check out.   Please contact our office, in 1 week, if recommendations, orders and imaging not available at the time of your appointment and we have not contacted you to review.      Please contact our office by phone or MyChart messaging     Adriana Cull, PA-C  Department of Urology  [facility]  [address]  [place], [place] [address]  Phone [phone]    fax [phone]      To make an appointment call Assistant:   Phone [phone]      If you need a medication refill have call Nurse and leave a message, Phone [phone]      If you need to fax records for review, please fax to: [phone]                Also, if you receive a request to complete an evaluation/survey regarding your office visit today, I would appreciate your feedback, comments and suggestions.    We do review them :), this helps [place] understand from the patient's point of view, your experience and expectations.  We use this information to improve patient care and provide feedback to our Urology Team, to recognize areas of strength and areas of opportunity for improvement and growth.       Thank you in advance for your time ~         Recommend urine cultures with signs and symptoms of infection:     Please contact office or MyChart Message, with current signs and symptoms and to notify that urine culture was submitted.    If submitted outside of [facility], please forward results    Adriana Cull, PA-C  Department of Urology  [facility]  [address]  [place], [place] [address]  Phone [phone]    fax [phone]          [employer]     [email or web address]            Recommend trial of Levsin    - this medication should be inexpensive, if it is not covered or greater than $25 with your insurance, contact our office and we can review using a coupon card, Good Rx with a written prescription    - levsin can help with bladder pressure, urethral discomfort, urgency and frequency     - some patients use this medication daily, use occasionally with flares, as needed, and/or nightly to help with primary nighttime bothersome urinary symptoms.    - If taking levsin is helpful and you are taking often, contact our office and we can discuss using a different daily bladder medication, that would be a single dose every 24 hours.    - I usually start with a smaller quantity, to see if the medication is helpful, if you need a larger quantity to help manage your symptoms on a frequent basis, contact our office and we can adjust the prescription            Hyoscyamine sublingual tablet  Brand Names: A-Spas S/L, HyoMax-SL, Hyosol SL, Levsin SL, OSCIMIN, Symax  What is this medicine?  HYOSCYAMINE (hye oh SYE a meen) is used to treat stomach and bladder problems. This medicine is also used for rhinitis, to reduce some problems  caused by Parkinson's disease, and for the treatment of poisoning with drugs that are usually used to treat myasthenia gravis.  How should I use this medicine?  Take this medicine by mouth. Follow the directions on the prescription label. These tablets may be placed under the tongue, and allowed to dissolve, swallowed whole, or chewed. Take your doses at regular intervals. Do not take your medicine more often than directed.  Talk to your pediatrician regarding the use of this medicine in children. Special care may be needed. While this medicine may be prescribed for children as young as 12 years for selected conditions, precautions do apply.  What side effects may I notice from receiving this medicine?  Side effects that you should report to your doctor or health care professional as soon as possible:  anxiety, nervousness  confusion  dizziness or fainting spells  fast heartbeat  fever  pain or difficulty passing urine  unusually weak or tired  vomiting  Side effects that usually do not require medical attention (report to your doctor or health care professional if they continue or are bothersome):  altered taste  constipation  nausea  What may interact with this medicine?  amantadine  antacids  benztropine  donepezil  galantamine  medicines for hay fever and other allergies  medicines for mental depression  medicines for mental problems or psychotic disturbances  rivastigmine  tacrine    What if I miss a dose?  If you miss a dose, take it as soon as you can. If it is almost time for your next dose, take only that dose. Do not take double or extra doses.  Where should I keep my medicine?  Keep out of the reach of children.  Store at room temperature between 15 and 30 degrees C (59 and 86 degrees F). Throw away any unused medicine after the expiration date.  What should I tell my health care provider before I take this medicine?  They need to know if you have any of these conditions:  difficulty passing urine  glaucoma  heart disease, or previous heart attack  myasthenia gravis  prostate trouble  stomach obstruction  ulcerative colitis  an unusual or allergic reaction to hyoscyamine, other medicines, foods, dyes, or preservatives  pregnant or trying to get pregnant  breast-feeding  What should I watch for while using this medicine?  You may get dizzy. Do not drive, use machinery, or do anything that needs mental alertness until you know how this medicine affects you. To reduce the risk of dizzy or fainting spells, do not sit or stand up quickly, especially if you are an older patient. Alcohol can make you more dizzy. Avoid alcoholic drinks.  Stay out of bright light and wear sunglasses if this medicine makes your eyes more sensitive to light.  Your mouth may get dry. Chewing sugarless gum or sucking hard candy, and drinking plenty of water may help. Contact your doctor if the problem does not go away or is severe.  This medicine may cause dry eyes and blurred vision. If you wear contact lenses you may feel some discomfort. Lubricating drops may help. See your eye doctor if the problem does not go away or is severe.  Avoid extreme heat (e.g., hot tubs, saunas). This medicine can cause you to sweat less than normal. Your body temperature could increase to dangerous levels, which may lead to heat stroke.  NOTE:This sheet is a summary. It may not cover all possible information. If you have questions  about this medicine, talk to your doctor, pharmacist, or health care provider. Copyright? 2018 Elsevier          [facility]  [address] [place], [place] [address]  [phone]  *Located off of [address] and [address]  ?  [facility]ELVIC [facility] MUSCLE [facility]  [address]  [place], [place] [address]  [phone]  ?  [facility]   [address]  [place], [place]   [phone]  ?  [facility]   [address] [place], [place]  [phone]    [facility]  [address]   [place], [place] [address]  [phone]  ?  [facility]  [address]   [place], [place]  [phone]  ?  [facility]   [address]  [place], [place]   [phone]    [facility]  [address]  [place], [place] [address]  [phone]    [facility]YSICAL [facility]  [address]  [place], [place] [address]  [phone]  ?  [facility]   [address]  [place], [place]  [phone]  ?  [facility]  [address] [place], [place]  [phone]  ?  [facility]  [address]  [place], [place]  [phone]  ?  [facility]AL [facility]  [address]   [place], [place]  [phone]  [facility]  [address]   [place], [place] [phone]  ?  OUT PATIENT @ [facility]  [address] [place], [place]  [phone]  ?  [facility]   [address]  [place], [place]  [phone]    PREFERRED PHYSICAL THERAPY  LOCATIONS IN [place], [place]  [phone]  ?  [facility]   [address]  , [place]  [phone]    [facility]  [address]   [place] [address]  [phone]  ?  [facility]  [address]  [place], [place]  [phone]  ?  [facility]  [address] [place], [place]  [phone]  [facility]  [address]  [place], [place]  [phone]    [facility]  [address]  [place], [place]  [phone]  ?  [facility]  [address]  [place], [place]  [phone]        [facility]  [address] [place], [place] [address]  [phone]  ?    Bladder Diet Information     Least Bothersome Foods Most Bothersome Foods   Fruit   Apricots  Bananas  Blueberries  Dates  Melon (honeydew and watermelon)  Prunes  Pears  Raisins Cranberry juice  Grapefruit and grapefruit juice  Lemons  Oranges and orange juice  Pineapple and pineapple juice  Strawberries     Vegetables   Avocados  Asparagus  Beets  Broccoli  Brussels Sprouts  Cabbage  Carrots  Cauliflower  Celery  Cucumber  Eggplant  Mushrooms  Peas  Potatoes (white potatoes, yams, sweet potatoes)  Radishes  Spinach  Squash  Turnips  Zucchini Chili peppers  Pickles  Sauerkraut  Tomatoes and tomato products     Grains   Oats  Rice    Proteins   Beef  Fish (shrimp, tuna fish and salmon)  Eggs  Nuts  Peanut butter  Pork  Poultry (chicken and [place])  Clinical biochemist   Processed sandwich meats (salami, bologna)  Soy     Dairy   Milk (low-fat and whole)  Cheeses (mild) Yogurt     Condiments   Herbs  Garlic or any herb  infused olive oil Chili  Horseradish  Ketchup  Salad Dressings  Soy sauce  Vinegar  [place] Sauce     Beverages   Grain beverages/Coffee substitutes (Cafix?, Pero?, Roma, Postum?)  Water   Alcohol  Coffee (caffeinated and decaffeinated)  Tea (caffeinated and decaffeinated)  Carbonated drinks  (cola, non-cola, diet, and caffeine-free)   Other Foods   Popcorn  Pretzels Chocolate  [place] food  [place] food  Pizza  Spicy foods  [place] food   Additives/Artificial Sweeteners    Artificial sweeteners (Equal? (sweetener), NutraSweet?, Saccharin, and Sweet?N Low?)  Monosodium glutamate (MSG

## 2020-10-18 ENCOUNTER — Encounter: Admit: 2020-10-18 | Discharge: 2020-10-18 | Payer: MEDICAID

## 2020-10-26 ENCOUNTER — Encounter: Admit: 2020-10-26 | Discharge: 2020-10-26 | Payer: MEDICAID

## 2020-10-26 DIAGNOSIS — R350 Frequency of micturition: Secondary | ICD-10-CM

## 2020-10-26 DIAGNOSIS — R102 Pelvic and perineal pain: Secondary | ICD-10-CM

## 2020-10-26 DIAGNOSIS — R3915 Urgency of urination: Secondary | ICD-10-CM

## 2020-10-26 DIAGNOSIS — Z87891 Personal history of nicotine dependence: Secondary | ICD-10-CM

## 2020-10-26 DIAGNOSIS — N3010 Interstitial cystitis (chronic) without hematuria: Secondary | ICD-10-CM

## 2020-11-10 ENCOUNTER — Encounter: Admit: 2020-11-10 | Discharge: 2020-11-10 | Payer: MEDICAID

## 2020-11-10 ENCOUNTER — Ambulatory Visit: Admit: 2020-11-10 | Discharge: 2020-11-10 | Payer: MEDICAID

## 2020-11-10 DIAGNOSIS — Z87891 Personal history of nicotine dependence: Secondary | ICD-10-CM

## 2020-11-10 DIAGNOSIS — N3010 Interstitial cystitis (chronic) without hematuria: Secondary | ICD-10-CM

## 2020-11-10 DIAGNOSIS — R350 Frequency of micturition: Secondary | ICD-10-CM

## 2020-11-10 DIAGNOSIS — R102 Pelvic and perineal pain: Secondary | ICD-10-CM

## 2020-11-10 DIAGNOSIS — R3915 Urgency of urination: Secondary | ICD-10-CM

## 2020-11-25 ENCOUNTER — Encounter: Admit: 2020-11-25 | Discharge: 2020-11-25 | Payer: MEDICAID

## 2020-11-25 NOTE — Telephone Encounter
Patient left vmx stating that she has questions. She is requesting a call back.     Attempted to call patient back, no answer. Left vmx advising patient to call back so we can answer her questions. Call back number provided.

## 2020-12-22 ENCOUNTER — Encounter: Admit: 2020-12-22 | Discharge: 2020-12-22 | Payer: MEDICAID

## 2020-12-22 ENCOUNTER — Ambulatory Visit: Admit: 2020-12-22 | Discharge: 2020-12-22 | Payer: MEDICAID

## 2020-12-22 DIAGNOSIS — I219 Acute myocardial infarction, unspecified: Secondary | ICD-10-CM

## 2020-12-22 DIAGNOSIS — N3010 Interstitial cystitis (chronic) without hematuria: Secondary | ICD-10-CM

## 2020-12-22 DIAGNOSIS — C801 Malignant (primary) neoplasm, unspecified: Secondary | ICD-10-CM

## 2020-12-22 DIAGNOSIS — IMO0001 Medical history reviewed with no changes: Secondary | ICD-10-CM

## 2020-12-22 DIAGNOSIS — R3989 Other symptoms and signs involving the genitourinary system: Secondary | ICD-10-CM

## 2020-12-22 DIAGNOSIS — A549 Gonococcal infection, unspecified: Secondary | ICD-10-CM

## 2020-12-22 DIAGNOSIS — A749 Chlamydial infection, unspecified: Secondary | ICD-10-CM

## 2020-12-22 DIAGNOSIS — J449 Chronic obstructive pulmonary disease, unspecified: Secondary | ICD-10-CM

## 2020-12-22 DIAGNOSIS — F99 Mental disorder, not otherwise specified: Secondary | ICD-10-CM

## 2020-12-22 DIAGNOSIS — B977 Papillomavirus as the cause of diseases classified elsewhere: Secondary | ICD-10-CM

## 2020-12-22 DIAGNOSIS — Z20818 Contact with and (suspected) exposure to other bacterial communicable diseases: Secondary | ICD-10-CM

## 2020-12-22 DIAGNOSIS — K929 Disease of digestive system, unspecified: Secondary | ICD-10-CM

## 2020-12-22 LAB — CHLAM/NG PCR SWAB
CHLAMYDIA PROBE: NEGATIVE
GONORRHEA PROBE: NEGATIVE

## 2020-12-22 MED ORDER — CHLORHEXIDINE GLUCONATE 4 % TP LIQD
TOPICAL | 0 refills | 16.00000 days | Status: AC | PRN
Start: 2020-12-22 — End: ?

## 2020-12-23 ENCOUNTER — Encounter: Admit: 2020-12-23 | Discharge: 2020-12-23 | Payer: MEDICAID

## 2020-12-23 NOTE — Telephone Encounter
Pt called and left vxm stating she did not get seen yesterday by Dr. Ernesta Amble. States they prescribed a wash for her to clean the outside of her vagina, but insurance will not cover it. Patient states, "I need to clean the inside of my vagina as well," and stated "the lady argued with me yesterday" about it and told pt to clean outside of vagina only. Pt states she had "maggot covered food" that she tossed into the toilet and it "splashed and got inside the rim" of the toilet and states she forgot to clean the toilet for a while and every time she used the restroom and she peed the stuff was getting on her vagina, butt, and on her leg. Pt states that her vaginal area itches, and she has hair falling out from her vagina and from her head. Pt states she needs antibiotics sent and to wash inside her vagina and butt as well. Pt also states she had a pad on and noted "juicy stuff that was brown" and states that it is "my vagina pushing out an infection." Pt states she is itching and uncomfortable every night and every day. Pt also states she would like to make a new appt b/c they made her make a new appt d/t not having a period since April and is not pregnant.

## 2020-12-23 NOTE — Telephone Encounter
Attempted to call pt , advised of neg results via vmx and will call her when her fungal culture results

## 2020-12-23 NOTE — Telephone Encounter
-----   Message from Mack Guise, MD sent at 12/23/2020  3:10 PM CDT -----  TV: negative    CT/NG: negative    Fungal culture pending. Please notify pt

## 2020-12-24 ENCOUNTER — Encounter: Admit: 2020-12-24 | Discharge: 2020-12-24 | Payer: MEDICAID

## 2020-12-24 NOTE — Telephone Encounter
SW covering OB this date. Message received 10/20 from [place], Midwife. Pt has been calling frequently, sounded incoherent and complaining of "maggot juice" in her vagina. 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 SW attempted to call mother who is listed on the permission to contact form, and pt, no response.    After discussing with Estill Bamberg, made a welfare check request due to team's concerns. Requested check from [facility] PD, [phone]. Call back from [facility] who reported pt was fine behaviorally this date. She stated they have responded to the home previously and pt today was "most coherent she's been". Pt declined medical attention.    Eunice Blase. SW will suggest to team that they obtain patients mental health provider information at future visits in case needed.    Sharlee Blew, [place]

## 2020-12-25 ENCOUNTER — Encounter: Admit: 2020-12-25 | Discharge: 2020-12-25 | Payer: MEDICAID

## 2020-12-25 DIAGNOSIS — Z20818 Contact with and (suspected) exposure to other bacterial communicable diseases: Secondary | ICD-10-CM

## 2020-12-25 MED ORDER — CHLORHEXIDINE GLUCONATE 4 % TP LIQD
TOPICAL | 0 refills | PRN
Start: 2020-12-25 — End: ?

## 2021-01-03 ENCOUNTER — Encounter: Admit: 2021-01-03 | Discharge: 2021-01-03 | Payer: MEDICAID

## 2021-01-04 ENCOUNTER — Encounter: Admit: 2021-01-04 | Discharge: 2021-01-04 | Payer: MEDICAID

## 2021-01-04 NOTE — Telephone Encounter
Called pharmacy and notified of denied PA for hibiclens, v/u.

## 2021-01-20 ENCOUNTER — Encounter: Admit: 2021-01-20 | Discharge: 2021-01-20 | Payer: MEDICAID

## 2021-01-20 ENCOUNTER — Ambulatory Visit: Admit: 2021-01-20 | Discharge: 2021-01-20 | Payer: MEDICAID

## 2021-01-20 DIAGNOSIS — R350 Frequency of micturition: Secondary | ICD-10-CM

## 2021-01-20 DIAGNOSIS — B977 Papillomavirus as the cause of diseases classified elsewhere: Secondary | ICD-10-CM

## 2021-01-20 DIAGNOSIS — A749 Chlamydial infection, unspecified: Secondary | ICD-10-CM

## 2021-01-20 DIAGNOSIS — K929 Disease of digestive system, unspecified: Secondary | ICD-10-CM

## 2021-01-20 DIAGNOSIS — N3010 Interstitial cystitis (chronic) without hematuria: Secondary | ICD-10-CM

## 2021-01-20 DIAGNOSIS — A549 Gonococcal infection, unspecified: Secondary | ICD-10-CM

## 2021-01-20 DIAGNOSIS — F99 Mental disorder, not otherwise specified: Secondary | ICD-10-CM

## 2021-01-20 DIAGNOSIS — R3915 Urgency of urination: Secondary | ICD-10-CM

## 2021-01-20 DIAGNOSIS — IMO0001 Medical history reviewed with no changes: Secondary | ICD-10-CM

## 2021-01-20 DIAGNOSIS — J449 Chronic obstructive pulmonary disease, unspecified: Secondary | ICD-10-CM

## 2021-01-20 DIAGNOSIS — C801 Malignant (primary) neoplasm, unspecified: Secondary | ICD-10-CM

## 2021-01-20 DIAGNOSIS — I219 Acute myocardial infarction, unspecified: Secondary | ICD-10-CM

## 2021-01-20 NOTE — Patient Instructions
Urinary Urgency Suppression Instructions    When you experience an urge to void:    First:  Stop & stand still.  Sit down if you can.  Don't move.  You need to stay very still to maintain control.    Second:  Relax.  Take a deep breath, then let it out.  Try to think of something other than the bathroom.  Try to make the urge go away.    Third:  Contract your pelvic muscles 3 to 4 times to keep from leaking.  Finally, when you feel the urge go away, walk normally to the bathroom.    If the urge recurs on the way, stop & repeat.  Practice this at home until you are able to suppress the urge & feel confident.  Do not be discouraged if control is not immediate.    Adapted from [facility] Dry -- Bennie [place].  Treatment program 6-34.  =======================================================================        Kegel Exercises    1.  During urination, attempt to stop your stream.  The muscle used to stop urination is called the external urinary sphincter.    2.  After you have correctly identified this muscle, you should follow a sphincter strengthening program outside of urination.    Follow this Schedule:  -- Slowly contract your sphincter muscle to a count of five.  Slowly release to a count of five.  Repeat 10 times. This is called a set.   -- It is important not to tense other muscles during this exercise.  You're your abdominal (belly), thigh, and butt muscles relaxed while you are doing Kegel exercises.    -- Repeat a set of exercises at least four times daily or up to once every hour.  To help you remember a time schedule, do a set at breakfast, lunch, dinner and bedtime.  Vary the position in which you do the exercises such as standing, lying, walking, and driving.  Different positions will strengthen the muscles in different ways.    -- To help prevent leakage with increased abdominal pressure, you may do a single Kegel squeeze and count of five when you are bending, stooping, getting up out of a chair, or bed or when lifting.    -- It is normal to experience leakage for several weeks or even several months after surgery.  Time, patience, and Kegel exercises will help restore your urinary control.    -- Guards for Men [employer]) is a pad that fits into your jockey shorts to absorb small to moderate amounts of urine.  It has an adhesive strip which adheres to the inside of your underwear.  You may purchase these pads at any major pharmacy or discount store.  For large amounts of leakage, you may wish to purchase Depends underwear.  It's a full brief that you use in place of underwear.  ====================================================================================          Constipation Relief Recipe    1 cup unsweetened applesauce  1 cup unprocessed wheat bran (a.k.a Miller?s bran)  1/4 cup prune juice    Mix all of the ingredients together.  Take 2 tablespoons a day for one week and drink with a full glass of water.  If bowel movements are not greatly improved, increase to 4 tablespoons a day for one week and continue to increase as needed.  The maximum dose is 8 tablespoons a day.  Each time you take the recipe, you need to drink an 8  ounce glass of water with it.    The mixture will keep for about one week in a plastic container in the refrigerator.

## 2021-02-03 ENCOUNTER — Ambulatory Visit: Admit: 2021-02-03 | Discharge: 2021-02-03 | Payer: MEDICAID

## 2021-02-03 ENCOUNTER — Encounter: Admit: 2021-02-03 | Discharge: 2021-02-03 | Payer: MEDICAID

## 2021-02-03 DIAGNOSIS — IMO0001 Medical history reviewed with no changes: Secondary | ICD-10-CM

## 2021-02-03 DIAGNOSIS — N3010 Interstitial cystitis (chronic) without hematuria: Secondary | ICD-10-CM

## 2021-02-03 DIAGNOSIS — I219 Acute myocardial infarction, unspecified: Secondary | ICD-10-CM

## 2021-02-03 DIAGNOSIS — R112 Nausea with vomiting, unspecified: Secondary | ICD-10-CM

## 2021-02-03 DIAGNOSIS — F99 Mental disorder, not otherwise specified: Secondary | ICD-10-CM

## 2021-02-03 DIAGNOSIS — K625 Hemorrhage of anus and rectum: Secondary | ICD-10-CM

## 2021-02-03 DIAGNOSIS — J449 Chronic obstructive pulmonary disease, unspecified: Secondary | ICD-10-CM

## 2021-02-03 DIAGNOSIS — A749 Chlamydial infection, unspecified: Secondary | ICD-10-CM

## 2021-02-03 DIAGNOSIS — R197 Diarrhea, unspecified: Secondary | ICD-10-CM

## 2021-02-03 DIAGNOSIS — R1310 Dysphagia, unspecified: Secondary | ICD-10-CM

## 2021-02-03 DIAGNOSIS — C801 Malignant (primary) neoplasm, unspecified: Secondary | ICD-10-CM

## 2021-02-03 DIAGNOSIS — K929 Disease of digestive system, unspecified: Secondary | ICD-10-CM

## 2021-02-03 DIAGNOSIS — A549 Gonococcal infection, unspecified: Secondary | ICD-10-CM

## 2021-02-03 DIAGNOSIS — B977 Papillomavirus as the cause of diseases classified elsewhere: Secondary | ICD-10-CM

## 2021-02-03 MED ORDER — PEG 3350-ELECTROLYTES 236-22.74-6.74 -5.86 GRAM PO SOLR
0 refills | 1.00000 days | Status: AC
Start: 2021-02-03 — End: ?

## 2021-02-20 ENCOUNTER — Encounter: Admit: 2021-02-20 | Discharge: 2021-02-20 | Payer: MEDICAID

## 2021-02-20 DIAGNOSIS — IMO0001 Medical history reviewed with no changes: Secondary | ICD-10-CM

## 2021-02-20 DIAGNOSIS — N3010 Interstitial cystitis (chronic) without hematuria: Secondary | ICD-10-CM

## 2021-02-20 DIAGNOSIS — K929 Disease of digestive system, unspecified: Secondary | ICD-10-CM

## 2021-02-20 DIAGNOSIS — A549 Gonococcal infection, unspecified: Secondary | ICD-10-CM

## 2021-02-20 DIAGNOSIS — J449 Chronic obstructive pulmonary disease, unspecified: Secondary | ICD-10-CM

## 2021-02-20 DIAGNOSIS — F99 Mental disorder, not otherwise specified: Secondary | ICD-10-CM

## 2021-02-20 DIAGNOSIS — I219 Acute myocardial infarction, unspecified: Secondary | ICD-10-CM

## 2021-02-20 DIAGNOSIS — B977 Papillomavirus as the cause of diseases classified elsewhere: Secondary | ICD-10-CM

## 2021-02-20 DIAGNOSIS — C801 Malignant (primary) neoplasm, unspecified: Secondary | ICD-10-CM

## 2021-02-20 DIAGNOSIS — A749 Chlamydial infection, unspecified: Secondary | ICD-10-CM

## 2021-05-05 ENCOUNTER — Encounter: Admit: 2021-05-05 | Discharge: 2021-05-05 | Payer: MEDICAID

## 2021-05-10 ENCOUNTER — Encounter: Admit: 2021-05-10 | Discharge: 2021-05-10 | Payer: MEDICAID

## 2021-06-09 ENCOUNTER — Encounter: Admit: 2021-06-09 | Discharge: 2021-06-09 | Payer: MEDICAID

## 2021-07-18 ENCOUNTER — Encounter: Admit: 2021-07-18 | Discharge: 2021-07-18 | Payer: MEDICAID

## 2021-09-30 ENCOUNTER — Encounter: Admit: 2021-09-30 | Discharge: 2021-09-30 | Payer: MEDICAID

## 2021-11-04 DEATH — deceased

## 2024-02-25 ENCOUNTER — Encounter: Admit: 2024-02-25 | Discharge: 2024-02-25 | Payer: MEDICAID
